# Patient Record
Sex: Female | Born: 1945 | Race: White | Hispanic: No | Marital: Married | Smoking: Never smoker
Health system: Southern US, Community
[De-identification: ages and names within clinical notes are randomized; demographics above are authoritative.]

## PROBLEM LIST (undated history)

## (undated) DIAGNOSIS — R42 Dizziness and giddiness: Secondary | ICD-10-CM

## (undated) DIAGNOSIS — R112 Nausea with vomiting, unspecified: Secondary | ICD-10-CM

## (undated) DIAGNOSIS — Z972 Presence of dental prosthetic device (complete) (partial): Secondary | ICD-10-CM

## (undated) DIAGNOSIS — E785 Hyperlipidemia, unspecified: Secondary | ICD-10-CM

## (undated) DIAGNOSIS — M1990 Unspecified osteoarthritis, unspecified site: Secondary | ICD-10-CM

## (undated) DIAGNOSIS — G35 Multiple sclerosis: Secondary | ICD-10-CM

## (undated) DIAGNOSIS — K219 Gastro-esophageal reflux disease without esophagitis: Secondary | ICD-10-CM

## (undated) DIAGNOSIS — E119 Type 2 diabetes mellitus without complications: Secondary | ICD-10-CM

## (undated) DIAGNOSIS — G4730 Sleep apnea, unspecified: Secondary | ICD-10-CM

## (undated) DIAGNOSIS — C801 Malignant (primary) neoplasm, unspecified: Secondary | ICD-10-CM

## (undated) DIAGNOSIS — Z9889 Other specified postprocedural states: Secondary | ICD-10-CM

## (undated) DIAGNOSIS — J45909 Unspecified asthma, uncomplicated: Secondary | ICD-10-CM

## (undated) HISTORY — PX: COLONOSCOPY: SHX174

## (undated) HISTORY — PX: ESOPHAGOGASTRODUODENOSCOPY: SHX1529

## (undated) HISTORY — PX: ABDOMINAL HYSTERECTOMY: SHX81

## (undated) HISTORY — PX: OTHER SURGICAL HISTORY: SHX169

---

## 1968-07-23 HISTORY — PX: BREAST EXCISIONAL BIOPSY: SUR124

## 2003-07-24 DIAGNOSIS — C801 Malignant (primary) neoplasm, unspecified: Secondary | ICD-10-CM

## 2003-07-24 HISTORY — DX: Malignant (primary) neoplasm, unspecified: C80.1

## 2004-06-07 ENCOUNTER — Ambulatory Visit: Payer: Self-pay | Admitting: Internal Medicine

## 2004-11-27 ENCOUNTER — Ambulatory Visit: Payer: Self-pay | Admitting: Unknown Physician Specialty

## 2005-09-12 ENCOUNTER — Ambulatory Visit: Payer: Self-pay | Admitting: Internal Medicine

## 2006-09-30 ENCOUNTER — Ambulatory Visit: Payer: Self-pay | Admitting: Internal Medicine

## 2007-10-02 ENCOUNTER — Ambulatory Visit: Payer: Self-pay | Admitting: Internal Medicine

## 2008-10-04 ENCOUNTER — Ambulatory Visit: Payer: Self-pay | Admitting: Internal Medicine

## 2009-10-06 ENCOUNTER — Ambulatory Visit: Payer: Self-pay | Admitting: Internal Medicine

## 2010-05-11 ENCOUNTER — Ambulatory Visit: Payer: Self-pay | Admitting: Neurology

## 2010-05-24 ENCOUNTER — Ambulatory Visit: Payer: Self-pay | Admitting: Neurology

## 2010-10-09 ENCOUNTER — Ambulatory Visit: Payer: Self-pay | Admitting: Internal Medicine

## 2011-10-18 ENCOUNTER — Ambulatory Visit: Payer: Self-pay | Admitting: Internal Medicine

## 2012-03-28 ENCOUNTER — Ambulatory Visit: Payer: Self-pay | Admitting: Internal Medicine

## 2012-04-04 ENCOUNTER — Ambulatory Visit: Payer: Self-pay | Admitting: Internal Medicine

## 2012-04-04 LAB — CREATININE, SERUM: Creatinine: 0.77 mg/dL (ref 0.60–1.30)

## 2012-04-10 ENCOUNTER — Ambulatory Visit: Payer: Self-pay | Admitting: Hematology and Oncology

## 2012-04-22 ENCOUNTER — Ambulatory Visit: Payer: Self-pay | Admitting: Hematology and Oncology

## 2012-05-23 ENCOUNTER — Ambulatory Visit: Payer: Self-pay | Admitting: Hematology and Oncology

## 2012-10-20 ENCOUNTER — Ambulatory Visit: Payer: Self-pay | Admitting: Internal Medicine

## 2012-10-21 ENCOUNTER — Ambulatory Visit: Payer: Self-pay | Admitting: Hematology and Oncology

## 2012-10-24 ENCOUNTER — Ambulatory Visit: Payer: Self-pay | Admitting: Hematology and Oncology

## 2012-11-20 ENCOUNTER — Ambulatory Visit: Payer: Self-pay | Admitting: Hematology and Oncology

## 2013-08-15 ENCOUNTER — Emergency Department: Payer: Self-pay | Admitting: Emergency Medicine

## 2013-08-15 LAB — CBC WITH DIFFERENTIAL/PLATELET
BASOS PCT: 0.6 %
Basophil #: 0.1 10*3/uL (ref 0.0–0.1)
Eosinophil #: 0.1 10*3/uL (ref 0.0–0.7)
Eosinophil %: 1.4 %
HCT: 42.5 % (ref 35.0–47.0)
HGB: 14.6 g/dL (ref 12.0–16.0)
Lymphocyte #: 2 10*3/uL (ref 1.0–3.6)
Lymphocyte %: 21.4 %
MCH: 30.3 pg (ref 26.0–34.0)
MCHC: 34.3 g/dL (ref 32.0–36.0)
MCV: 88 fL (ref 80–100)
MONO ABS: 0.5 x10 3/mm (ref 0.2–0.9)
MONOS PCT: 5.7 %
NEUTROS ABS: 6.6 10*3/uL — AB (ref 1.4–6.5)
NEUTROS PCT: 70.9 %
Platelet: 137 10*3/uL — ABNORMAL LOW (ref 150–440)
RBC: 4.81 10*6/uL (ref 3.80–5.20)
RDW: 12.4 % (ref 11.5–14.5)
WBC: 9.3 10*3/uL (ref 3.6–11.0)

## 2013-08-15 LAB — BASIC METABOLIC PANEL
Anion Gap: 5 — ABNORMAL LOW (ref 7–16)
BUN: 10 mg/dL (ref 7–18)
Calcium, Total: 8.7 mg/dL (ref 8.5–10.1)
Chloride: 104 mmol/L (ref 98–107)
Co2: 29 mmol/L (ref 21–32)
Creatinine: 0.65 mg/dL (ref 0.60–1.30)
EGFR (African American): >60
Glucose: 156 mg/dL — ABNORMAL HIGH (ref 65–99)
Osmolality: 278 (ref 275–301)
POTASSIUM: 2.9 mmol/L — AB (ref 3.5–5.1)
Sodium: 138 mmol/L (ref 136–145)

## 2013-08-15 LAB — URINALYSIS, COMPLETE
BLOOD: NEGATIVE
Bilirubin,UR: NEGATIVE
Leukocyte Esterase: NEGATIVE
Nitrite: NEGATIVE
Ph: 8 (ref 4.5–8.0)
Protein: NEGATIVE
RBC,UR: 1 /HPF (ref 0–5)
Specific Gravity: 1.009 (ref 1.003–1.030)
Squamous Epithelial: 1
WBC UR: NONE SEEN /HPF (ref 0–5)

## 2013-08-15 LAB — PHENYTOIN LEVEL, TOTAL: Dilantin: 11.7 ug/mL (ref 10.0–20.0)

## 2013-08-15 LAB — TROPONIN I: Troponin-I: <0.02 ng/mL

## 2013-08-21 ENCOUNTER — Ambulatory Visit: Payer: Self-pay | Admitting: Internal Medicine

## 2013-10-29 ENCOUNTER — Ambulatory Visit: Payer: Self-pay | Admitting: Hematology and Oncology

## 2013-11-20 ENCOUNTER — Ambulatory Visit: Payer: Self-pay | Admitting: Hematology and Oncology

## 2013-12-23 ENCOUNTER — Ambulatory Visit: Payer: Self-pay | Admitting: Specialist

## 2013-12-24 ENCOUNTER — Ambulatory Visit: Payer: Self-pay | Admitting: Internal Medicine

## 2013-12-28 ENCOUNTER — Ambulatory Visit: Payer: Self-pay | Admitting: Internal Medicine

## 2014-01-01 ENCOUNTER — Ambulatory Visit: Payer: Self-pay | Admitting: Internal Medicine

## 2014-01-01 HISTORY — PX: BREAST CYST ASPIRATION: SHX578

## 2014-01-19 ENCOUNTER — Ambulatory Visit: Payer: Self-pay | Admitting: Specialist

## 2014-03-25 DIAGNOSIS — J4520 Mild intermittent asthma, uncomplicated: Secondary | ICD-10-CM | POA: Insufficient documentation

## 2014-11-19 ENCOUNTER — Other Ambulatory Visit: Payer: Self-pay

## 2014-11-19 DIAGNOSIS — Z1231 Encounter for screening mammogram for malignant neoplasm of breast: Secondary | ICD-10-CM

## 2014-12-27 ENCOUNTER — Other Ambulatory Visit: Payer: Self-pay

## 2014-12-27 ENCOUNTER — Ambulatory Visit
Admission: RE | Admit: 2014-12-27 | Discharge: 2014-12-27 | Disposition: A | Discharge status: Home / Self Care | Payer: Medicare Other | Source: Ambulatory Visit | Attending: Internal Medicine | Admitting: Internal Medicine

## 2014-12-27 ENCOUNTER — Other Ambulatory Visit: Payer: Self-pay | Admitting: Internal Medicine

## 2014-12-27 DIAGNOSIS — Z1231 Encounter for screening mammogram for malignant neoplasm of breast: Secondary | ICD-10-CM

## 2014-12-27 HISTORY — DX: Malignant (primary) neoplasm, unspecified: C80.1

## 2015-01-14 ENCOUNTER — Encounter: Payer: Self-pay | Admitting: *Deleted

## 2015-01-17 ENCOUNTER — Encounter: Payer: Self-pay | Admitting: *Deleted

## 2015-01-17 ENCOUNTER — Ambulatory Visit
Admission: RE | Admit: 2015-01-17 | Discharge: 2015-01-17 | Disposition: A | Discharge status: Home / Self Care | Payer: Medicare Other | Source: Ambulatory Visit | Attending: Unknown Physician Specialty | Admitting: Unknown Physician Specialty

## 2015-01-17 ENCOUNTER — Ambulatory Visit: Payer: Medicare Other | Admitting: Anesthesiology

## 2015-01-17 ENCOUNTER — Encounter
Admission: RE | Disposition: A | Discharge status: Home / Self Care | Payer: Self-pay | Source: Ambulatory Visit | Attending: Unknown Physician Specialty

## 2015-01-17 DIAGNOSIS — Z9889 Other specified postprocedural states: Secondary | ICD-10-CM | POA: Diagnosis not present

## 2015-01-17 DIAGNOSIS — J45909 Unspecified asthma, uncomplicated: Secondary | ICD-10-CM | POA: Diagnosis not present

## 2015-01-17 DIAGNOSIS — E119 Type 2 diabetes mellitus without complications: Secondary | ICD-10-CM | POA: Diagnosis not present

## 2015-01-17 DIAGNOSIS — Z1211 Encounter for screening for malignant neoplasm of colon: Secondary | ICD-10-CM | POA: Insufficient documentation

## 2015-01-17 DIAGNOSIS — Z7982 Long term (current) use of aspirin: Secondary | ICD-10-CM | POA: Diagnosis not present

## 2015-01-17 DIAGNOSIS — G35 Multiple sclerosis: Secondary | ICD-10-CM | POA: Diagnosis not present

## 2015-01-17 DIAGNOSIS — G4730 Sleep apnea, unspecified: Secondary | ICD-10-CM | POA: Diagnosis not present

## 2015-01-17 DIAGNOSIS — Z79899 Other long term (current) drug therapy: Secondary | ICD-10-CM | POA: Insufficient documentation

## 2015-01-17 DIAGNOSIS — M1990 Unspecified osteoarthritis, unspecified site: Secondary | ICD-10-CM | POA: Diagnosis not present

## 2015-01-17 DIAGNOSIS — Z85828 Personal history of other malignant neoplasm of skin: Secondary | ICD-10-CM | POA: Insufficient documentation

## 2015-01-17 DIAGNOSIS — K640 First degree hemorrhoids: Secondary | ICD-10-CM | POA: Insufficient documentation

## 2015-01-17 DIAGNOSIS — E785 Hyperlipidemia, unspecified: Secondary | ICD-10-CM | POA: Insufficient documentation

## 2015-01-17 HISTORY — PX: COLONOSCOPY WITH PROPOFOL: SHX5780

## 2015-01-17 HISTORY — DX: Sleep apnea, unspecified: G47.30

## 2015-01-17 HISTORY — DX: Multiple sclerosis: G35

## 2015-01-17 HISTORY — DX: Unspecified asthma, uncomplicated: J45.909

## 2015-01-17 HISTORY — DX: Unspecified osteoarthritis, unspecified site: M19.90

## 2015-01-17 HISTORY — DX: Type 2 diabetes mellitus without complications: E11.9

## 2015-01-17 HISTORY — DX: Hyperlipidemia, unspecified: E78.5

## 2015-01-17 LAB — GLUCOSE, CAPILLARY: GLUCOSE-CAPILLARY: 118 mg/dL — AB (ref 65–99)

## 2015-01-17 SURGERY — COLONOSCOPY WITH PROPOFOL
Anesthesia: General

## 2015-01-17 MED ORDER — LIDOCAINE HCL (PF) 2 % IJ SOLN
INTRAMUSCULAR | Status: DC | PRN
  Administered 2015-01-17: 50 mg

## 2015-01-17 MED ORDER — MIDAZOLAM HCL 5 MG/5ML IJ SOLN
INTRAMUSCULAR | Status: DC | PRN
  Administered 2015-01-17: 1 mg via INTRAVENOUS

## 2015-01-17 MED ORDER — SODIUM CHLORIDE 0.9 % IV SOLN: INTRAVENOUS | Status: DC

## 2015-01-17 MED ORDER — PROPOFOL INFUSION 10 MG/ML OPTIME
INTRAVENOUS | Status: DC | PRN
  Administered 2015-01-17: 100 ug/kg/min via INTRAVENOUS

## 2015-01-17 MED ORDER — LACTATED RINGERS IV SOLN
INTRAVENOUS | Status: DC | PRN
  Administered 2015-01-17: 09:00:00 via INTRAVENOUS

## 2015-01-17 MED ORDER — PROPOFOL 10 MG/ML IV BOLUS
INTRAVENOUS | Status: DC | PRN
  Administered 2015-01-17: 50 mg via INTRAVENOUS

## 2015-01-17 MED ORDER — FENTANYL CITRATE (PF) 100 MCG/2ML IJ SOLN
INTRAMUSCULAR | Status: DC | PRN
  Administered 2015-01-17: 50 ug via INTRAVENOUS

## 2015-01-17 MED ORDER — SODIUM CHLORIDE 0.9 % IV SOLN
INTRAVENOUS | Status: DC
  Administered 2015-01-17: 08:00:00 via INTRAVENOUS

## 2015-01-17 NOTE — Anesthesia Postprocedure Evaluation (Signed)
  Anesthesia Post-op Note  Patient: Caroline Hall  Procedure(s) Performed: Procedure(s): COLONOSCOPY WITH PROPOFOL (N/A)  Anesthesia type:General  Patient location: PACU  Post pain: Pain level controlled  Post assessment: Post-op Vital signs reviewed, Patient's Cardiovascular Status Stable, Respiratory Function Stable, Patent Airway and No signs of Nausea or vomiting  Post vital signs: Reviewed and stable  Last Vitals:  Filed Vitals:   01/17/15 0910  BP: 104/55  Pulse: 81  Temp:   Resp: 18    Level of consciousness: awake, alert  and patient cooperative  Complications: No apparent anesthesia complications

## 2015-01-17 NOTE — Anesthesia Preprocedure Evaluation (Signed)
Anesthesia Evaluation  Patient identified by MRN, date of birth, ID band Patient awake    Reviewed: Allergy & Precautions, H&P , NPO status , Patient's Chart, lab work & pertinent test results, reviewed documented beta blocker date and time   Airway Mallampati: II  TM Distance: >3 FB Neck ROM: full    Dental no notable dental hx.    Pulmonary neg pulmonary ROS, asthma , sleep apnea ,  breath sounds clear to auscultation  Pulmonary exam normal       Cardiovascular Exercise Tolerance: Good negative cardio ROS  Rhythm:regular Rate:Normal     Neuro/Psych MS hx negative neurological ROS  negative psych ROS   GI/Hepatic negative GI ROS, Neg liver ROS,   Endo/Other  negative endocrine ROSdiabetes  Renal/GU negative Renal ROS  negative genitourinary   Musculoskeletal   Abdominal   Peds  Hematology negative hematology ROS (+)   Anesthesia Other Findings   Reproductive/Obstetrics negative OB ROS                             Anesthesia Physical Anesthesia Plan  ASA: III  Anesthesia Plan: General   Post-op Pain Management:    Induction:   Airway Management Planned:   Additional Equipment:   Intra-op Plan:   Post-operative Plan:   Informed Consent: I have reviewed the patients History and Physical, chart, labs and discussed the procedure including the risks, benefits and alternatives for the proposed anesthesia with the patient or authorized representative who has indicated his/her understanding and acceptance.   Dental Advisory Given  Plan Discussed with: CRNA  Anesthesia Plan Comments:         Anesthesia Quick Evaluation

## 2015-01-17 NOTE — Op Note (Signed)
[facility] Patient Name: Caroline Hall Procedure Date: 01/17/2015 8:31 AM MRN: [id number] Account #: [id number] Date of Birth: [birth year] Admit Type: Outpatient Age: 69 Room: [facility] ENDO ROOM 1 Gender: Female Note Status: Finalized Procedure:         Colonoscopy Indications:       Screening for colorectal malignant neoplasm Providers:         Manya Silvas, MD Referring MD:      Leonie Douglas. Doy Hutching, MD (Referring MD) Medicines:         Propofol per Anesthesia Complications:     No immediate complications. Procedure:         Pre-Anesthesia Assessment:                    - After reviewing the risks and benefits, the patient was                     deemed in satisfactory condition to undergo the procedure.                    After obtaining informed consent, the colonoscope was                     passed under direct vision. Throughout the procedure, the                     patient's blood pressure, pulse, and oxygen saturations                     were monitored continuously. The Olympus PCF-160AL                     colonoscope (S#. [id number]) was introduced through the anus                     and advanced to the the cecum, identified by appendiceal                     orifice and ileocecal valve. The colonoscopy was performed                     without difficulty. The patient tolerated the procedure                     well. The quality of the bowel preparation was excellent. Findings:      Internal hemorrhoids were found during endoscopy. The hemorrhoids were       small and Grade I (internal hemorrhoids that do not prolapse).      The exam was otherwise without abnormality. Impression:        - Internal hemorrhoids.                    - The examination was otherwise normal.                    - No specimens collected. Recommendation:    - Repeat colonoscopy in 10 years for screening purposes. Manya Silvas, MD 01/17/2015 8:57:03 AM This  report has been signed electronically. Number of Addenda: 0 Note Initiated On: 01/17/2015 8:31 AM Scope Withdrawal Time: 0 hours 10 minutes 0 seconds  Total Procedure Duration: 0 hours 16 minutes 50 seconds       [facility], [place]

## 2015-01-17 NOTE — H&P (Signed)
Primary Care Physician:  Idelle Crouch, MD Primary Gastroenterologist:  Dr. Vira Agar  Pre-Procedure History & Physical: HPI:  Caroline Hall is a 69 y.o. female is here for an colonoscopy.   Past Medical History  Diagnosis Date  . Cancer 2005    skin, face  . Asthma   . Sleep apnea   . Multiple sclerosis   . Arthritis   . Diabetes mellitus without complication   . Hyperlipidemia     Past Surgical History  Procedure Laterality Date  . Breast cyst aspiration Right 01/01/2014    negative  . Breast biopsy Bilateral 1970    surgical biopsy, negative, approximate dates  . Meningioma removal    . Abdominal hysterectomy    . Colonoscopy  [id number]  . Esophagogastroduodenoscopy      Prior to Admission medications   Medication Sig Start Date End Date Taking? Authorizing Provider  albuterol (PROAIR HFA) 108 (90 BASE) MCG/ACT inhaler Inhale 2 puffs into the lungs every 6 (six) hours as needed for wheezing or shortness of breath.  03/25/14  Yes Historical Provider, MD  atorvastatin (LIPITOR) 10 MG tablet Take 10 mg by mouth daily. 06/28/14  Yes Historical Provider, MD  Cyanocobalamin ([employer] B-12) 1500 MCG TBDP Take 1 tablet by mouth daily.   Yes Historical Provider, MD  diphenhydrAMINE (BENADRYL) 25 MG tablet Take 25 mg by mouth every 6 (six) hours as needed for allergies.   Yes Historical Provider, MD  folic acid (FOLVITE) 762 MCG tablet Take 800 mcg by mouth daily.   Yes Historical Provider, MD  gabapentin (NEURONTIN) 300 MG capsule Take 300 mg by mouth at bedtime. 05/14/14  Yes Historical Provider, MD  glimepiride (AMARYL) 4 MG tablet Take 4 mg by mouth daily with breakfast.   Yes Historical Provider, MD  montelukast (SINGULAIR) 10 MG tablet Take 10 mg by mouth at bedtime.   Yes Historical Provider, MD  NON FORMULARY Take 10 mg by mouth 2 (two) times daily. 4 aminopyridine   Yes Historical Provider, MD  phenytoin (DILANTIN) 100 MG ER capsule Take 300 mg by mouth at bedtime.   Yes  Historical Provider, MD  polyethylene glycol powder (GLYCOLAX/MIRALAX) powder Take as directed for colonoscopy prep. 12/07/14  Yes Historical Provider, MD  aspirin EC 81 MG tablet Take 81 mg by mouth daily.    Historical Provider, MD    Allergies as of 12/07/2014  . (Not on File)    Family History  Problem Relation Age of Onset  . Breast cancer Maternal Grandmother 60    History   Social History  . Marital Status: Married    Spouse Name: N/A  . Number of Children: N/A  . Years of Education: N/A   Occupational History  . Not on file.   Social History Main Topics  . Smoking status: Never Smoker   . Smokeless tobacco: Never Used  . Alcohol Use: No  . Drug Use: No  . Sexual Activity: Not on file   Other Topics Concern  . Not on file   Social History Narrative    Review of Systems: See HPI, otherwise negative ROS  Physical Exam: BP 151/90 mmHg  Pulse 85  Temp(Src) 97.4 F (36.3 C) (Tympanic)  Resp 18  Ht 5\' 4"  (1.626 m)  Wt 73.029 kg (161 lb)  BMI 27.62 kg/m2  SpO2 100% General:   Alert,  pleasant and cooperative in NAD Head:  Normocephalic and atraumatic. Neck:  Supple; no masses or thyromegaly. Lungs:  Clear throughout  to auscultation.    Heart:  Regular rate and rhythm. Abdomen:  Soft, nontender and nondistended. Normal bowel sounds, without guarding, and without rebound.   Neurologic:  Alert and  oriented x4;  grossly normal neurologically.  Impression/Plan: Caroline Hall is here for an colonoscopy to be performed for screening  Risks, benefits, limitations, and alternatives regarding  colonoscopy have been reviewed with the patient.  Questions have been answered.  All parties agreeable.   Gaylyn Cheers, MD  01/17/2015, 8:30 AM   Primary Care Physician:  Idelle Crouch, MD Primary Gastroenterologist:  Dr. Vira Agar  Pre-Procedure History & Physical: HPI:  Caroline Hall is a 69 y.o. female is here for an colonoscopy.   Past Medical History   Diagnosis Date  . Cancer 2005    skin, face  . Asthma   . Sleep apnea   . Multiple sclerosis   . Arthritis   . Diabetes mellitus without complication   . Hyperlipidemia     Past Surgical History  Procedure Laterality Date  . Breast cyst aspiration Right 01/01/2014    negative  . Breast biopsy Bilateral 1970    surgical biopsy, negative, approximate dates  . Meningioma removal    . Abdominal hysterectomy    . Colonoscopy  [id number]  . Esophagogastroduodenoscopy      Prior to Admission medications   Medication Sig Start Date End Date Taking? Authorizing Provider  albuterol (PROAIR HFA) 108 (90 BASE) MCG/ACT inhaler Inhale 2 puffs into the lungs every 6 (six) hours as needed for wheezing or shortness of breath.  03/25/14  Yes Historical Provider, MD  atorvastatin (LIPITOR) 10 MG tablet Take 10 mg by mouth daily. 06/28/14  Yes Historical Provider, MD  Cyanocobalamin ([employer]S B-12) 1500 MCG TBDP Take 1 tablet by mouth daily.   Yes Historical Provider, MD  diphenhydrAMINE (BENADRYL) 25 MG tablet Take 25 mg by mouth every 6 (six) hours as needed for allergies.   Yes Historical Provider, MD  folic acid (FOLVITE) 007 MCG tablet Take 800 mcg by mouth daily.   Yes Historical Provider, MD  gabapentin (NEURONTIN) 300 MG capsule Take 300 mg by mouth at bedtime. 05/14/14  Yes Historical Provider, MD  glimepiride (AMARYL) 4 MG tablet Take 4 mg by mouth daily with breakfast.   Yes Historical Provider, MD  montelukast (SINGULAIR) 10 MG tablet Take 10 mg by mouth at bedtime.   Yes Historical Provider, MD  NON FORMULARY Take 10 mg by mouth 2 (two) times daily. 4 aminopyridine   Yes Historical Provider, MD  phenytoin (DILANTIN) 100 MG ER capsule Take 300 mg by mouth at bedtime.   Yes Historical Provider, MD  polyethylene glycol powder (GLYCOLAX/MIRALAX) powder Take as directed for colonoscopy prep. 12/07/14  Yes Historical Provider, MD  aspirin EC 81 MG tablet Take 81 mg by mouth daily.    Historical  Provider, MD    Allergies as of 12/07/2014  . (Not on File)    Family History  Problem Relation Age of Onset  . Breast cancer Maternal Grandmother 60    History   Social History  . Marital Status: Married    Spouse Name: N/A  . Number of Children: N/A  . Years of Education: N/A   Occupational History  . Not on file.   Social History Main Topics  . Smoking status: Never Smoker   . Smokeless tobacco: Never Used  . Alcohol Use: No  . Drug Use: No  . Sexual Activity: Not on file  Other Topics Concern  . Not on file   Social History Narrative    Review of Systems: See HPI, otherwise negative ROS  Physical Exam: BP 151/90 mmHg  Pulse 85  Temp(Src) 97.4 F (36.3 C) (Tympanic)  Resp 18  Ht 5\' 4"  (1.626 m)  Wt 73.029 kg (161 lb)  BMI 27.62 kg/m2  SpO2 100% General:   Alert,  pleasant and cooperative in NAD Head:  Normocephalic and atraumatic. Neck:  Supple; no masses or thyromegaly. Lungs:  Clear throughout to auscultation.    Heart:  Regular rate and rhythm. Abdomen:  Soft, nontender and nondistended. Normal bowel sounds, without guarding, and without rebound.   Neurologic:  Alert and  oriented x4;  grossly normal neurologically.  Impression/Plan: JEZABEL LECKER is here for an colonoscopy to be performed for screening  Risks, benefits, limitations, and alternatives regarding  colonoscopy have been reviewed with the patient.  Questions have been answered.  All parties agreeable.   Gaylyn Cheers, MD  01/17/2015, 8:30 AM

## 2015-01-17 NOTE — Transfer of Care (Signed)
Immediate Anesthesia Transfer of Care Note  Patient: Caroline Hall  Procedure(s) Performed: Procedure(s): COLONOSCOPY WITH PROPOFOL (N/A)  Patient Location: PACU  Anesthesia Type:General  Level of Consciousness: sedated  Airway & Oxygen Therapy: Patient Spontanous Breathing and Patient connected to nasal cannula oxygen  Post-op Assessment: Report given to RN and Post -op Vital signs reviewed and stable  Post vital signs: Reviewed and stable  Last Vitals:  Filed Vitals:   01/17/15 0903  BP: 124/58  Pulse: 80  Temp:   Resp: 21    Complications: No apparent anesthesia complications

## 2015-03-07 IMAGING — CR DG CHEST 2V
1 series · 2 of 2 positions shown · non-contrast
Comparison: None.

CLINICAL DATA: Dizziness.  Hypertension.

EXAM:
CHEST  2 VIEW

[Series 1: ap · 0.17mm/px · 2 of 2 slices shown]
[im 1/2]
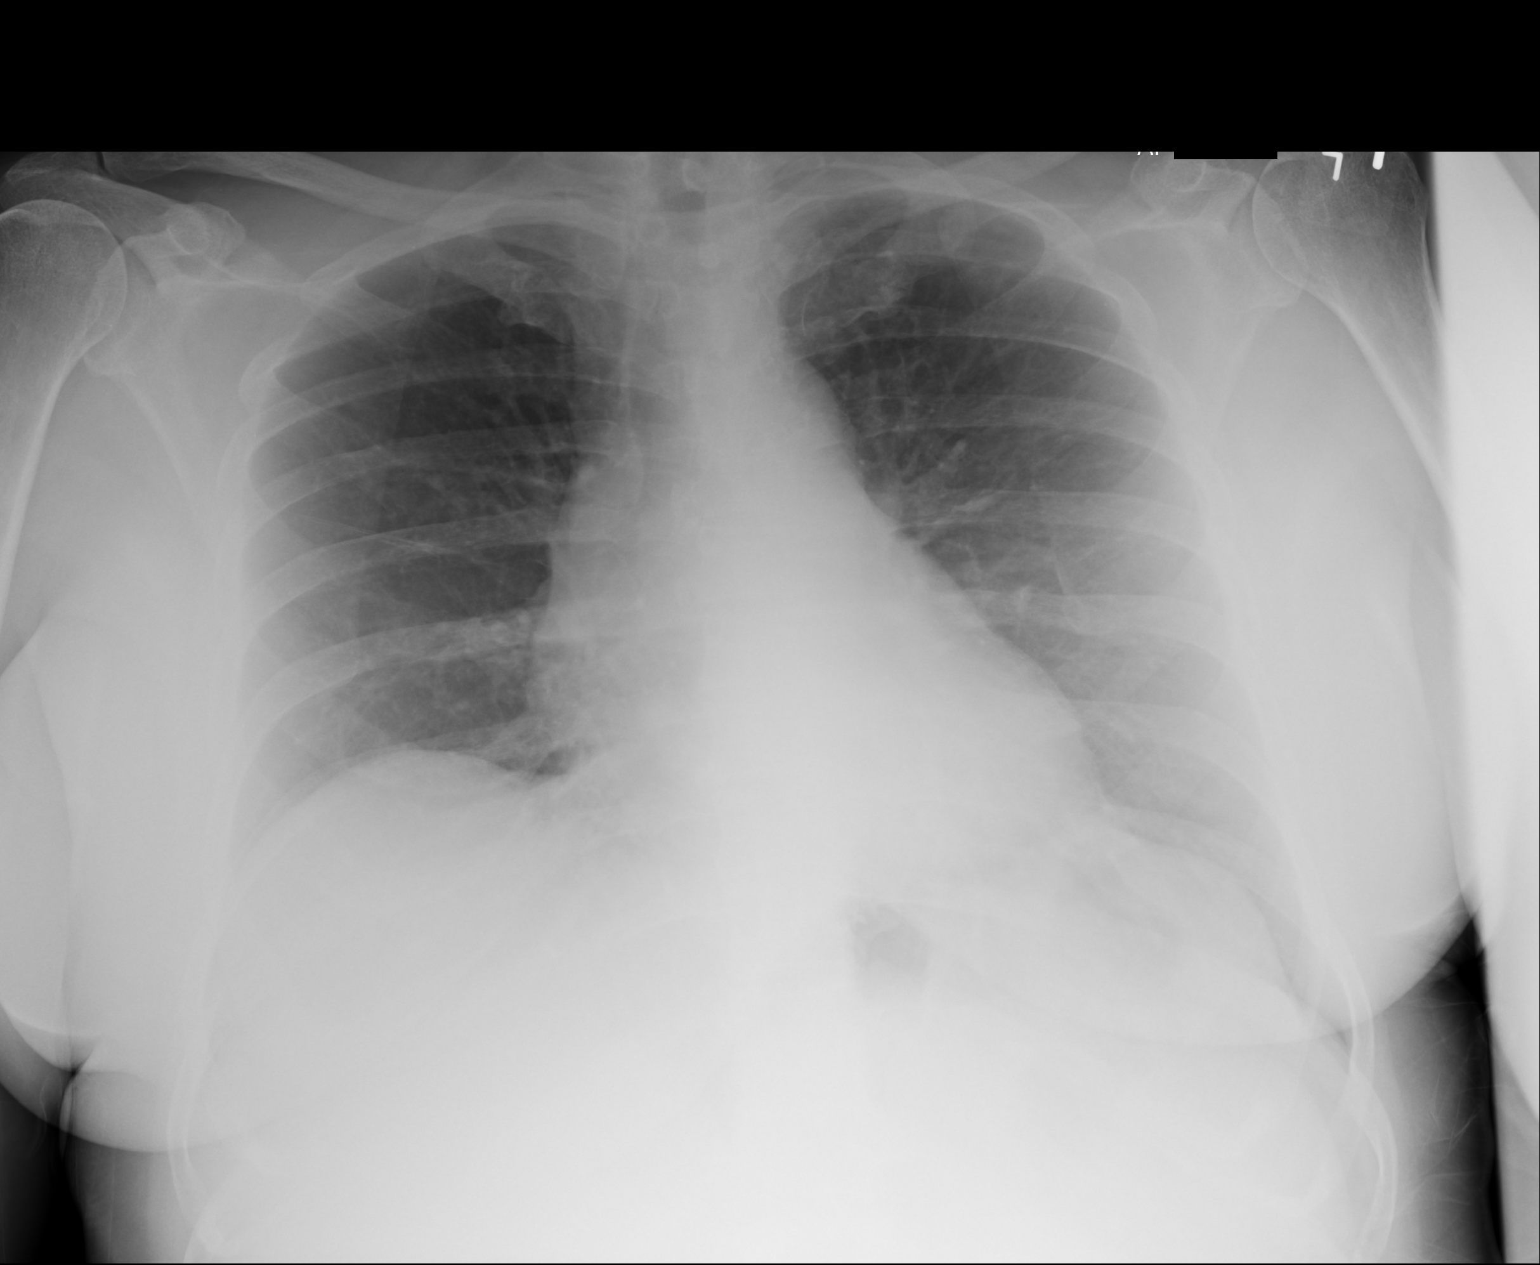
[im 2/2]
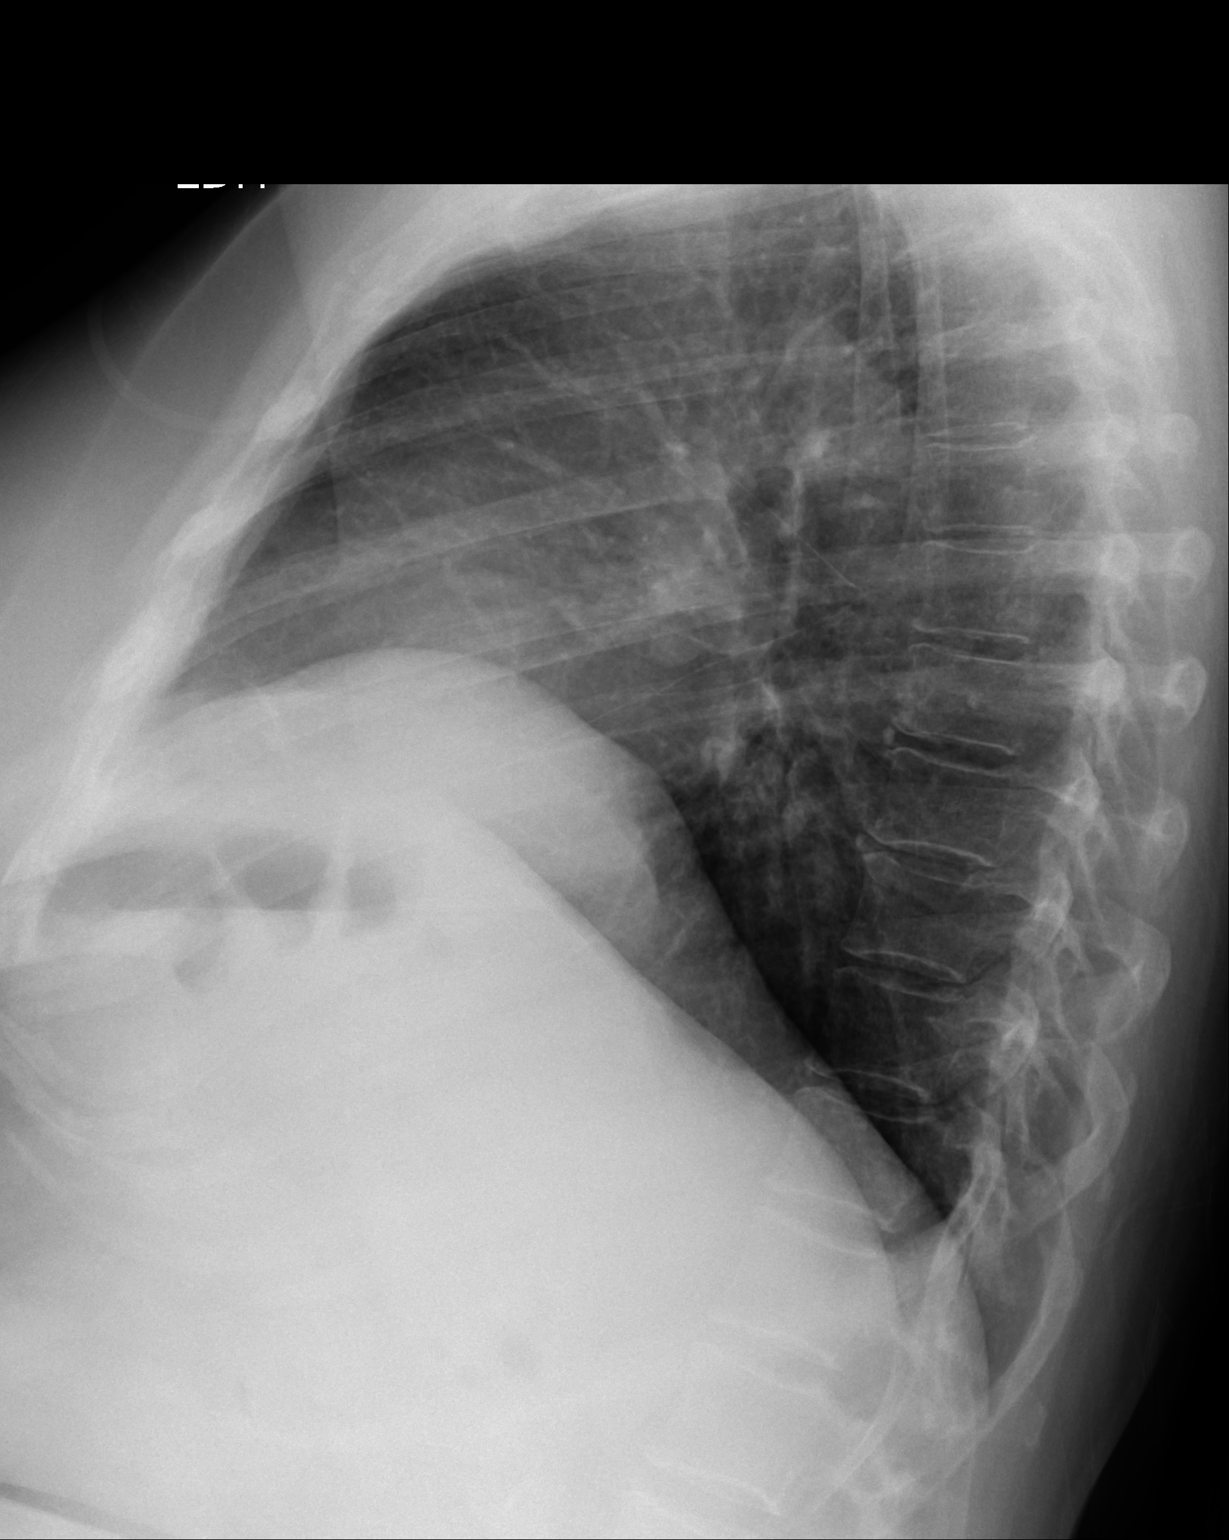

[2 of 2 positions shown; findings below may reference images not displayed]

FINDINGS: The heart size and mediastinal contours are within normal limits.
Both lungs are clear. The visualized skeletal structures are
unremarkable.
IMPRESSION: No active cardiopulmonary disease.

## 2015-12-09 ENCOUNTER — Other Ambulatory Visit: Payer: Self-pay | Admitting: Internal Medicine

## 2015-12-09 DIAGNOSIS — Z1231 Encounter for screening mammogram for malignant neoplasm of breast: Secondary | ICD-10-CM

## 2015-12-28 ENCOUNTER — Other Ambulatory Visit: Payer: Self-pay | Admitting: Internal Medicine

## 2015-12-28 ENCOUNTER — Ambulatory Visit
Admission: RE | Admit: 2015-12-28 | Discharge: 2015-12-28 | Disposition: A | Discharge status: Home / Self Care | Payer: Medicare Other | Source: Ambulatory Visit | Attending: Internal Medicine | Admitting: Internal Medicine

## 2015-12-28 DIAGNOSIS — Z1231 Encounter for screening mammogram for malignant neoplasm of breast: Secondary | ICD-10-CM | POA: Diagnosis present

## 2016-12-06 ENCOUNTER — Other Ambulatory Visit: Payer: Self-pay | Admitting: Internal Medicine

## 2016-12-06 DIAGNOSIS — Z1231 Encounter for screening mammogram for malignant neoplasm of breast: Secondary | ICD-10-CM

## 2017-01-01 ENCOUNTER — Ambulatory Visit
Admission: RE | Admit: 2017-01-01 | Discharge: 2017-01-01 | Disposition: A | Discharge status: Home / Self Care | Payer: Medicare Other | Source: Ambulatory Visit | Attending: Internal Medicine | Admitting: Internal Medicine

## 2017-01-01 DIAGNOSIS — Z1231 Encounter for screening mammogram for malignant neoplasm of breast: Secondary | ICD-10-CM | POA: Diagnosis present

## 2017-06-10 ENCOUNTER — Other Ambulatory Visit: Payer: Self-pay | Admitting: Internal Medicine

## 2017-06-10 DIAGNOSIS — E278 Other specified disorders of adrenal gland: Secondary | ICD-10-CM

## 2017-06-10 DIAGNOSIS — E279 Disorder of adrenal gland, unspecified: Principal | ICD-10-CM

## 2017-06-18 ENCOUNTER — Other Ambulatory Visit: Payer: Self-pay | Admitting: Internal Medicine

## 2017-06-18 DIAGNOSIS — N281 Cyst of kidney, acquired: Secondary | ICD-10-CM

## 2017-06-28 ENCOUNTER — Ambulatory Visit
Admission: RE | Admit: 2017-06-28 | Discharge: 2017-06-28 | Disposition: A | Discharge status: Home / Self Care | Payer: Medicare Other | Source: Ambulatory Visit | Attending: Internal Medicine | Admitting: Internal Medicine

## 2017-06-28 DIAGNOSIS — E279 Disorder of adrenal gland, unspecified: Secondary | ICD-10-CM | POA: Insufficient documentation

## 2017-06-28 DIAGNOSIS — N281 Cyst of kidney, acquired: Secondary | ICD-10-CM | POA: Insufficient documentation

## 2017-07-30 ENCOUNTER — Other Ambulatory Visit: Payer: Self-pay | Admitting: Podiatry

## 2017-07-31 ENCOUNTER — Encounter: Payer: Self-pay | Admitting: Anesthesiology

## 2017-07-31 ENCOUNTER — Encounter: Payer: Self-pay | Admitting: *Deleted

## 2017-07-31 ENCOUNTER — Other Ambulatory Visit: Payer: Self-pay

## 2017-08-02 ENCOUNTER — Other Ambulatory Visit: Payer: Self-pay | Admitting: Podiatry

## 2017-08-05 NOTE — Discharge Instructions (Signed)
[facility] °[facility] ° °POST OPERATIVE INSTRUCTIONS FOR DR. TROXLER AND DR. FOWLER °[facility] ° ° °1. Take your medication as prescribed.  Pain medication should be taken only as needed. ° °2. Keep the dressing clean, dry and intact. ° °3. Keep your foot elevated above the heart level for the first 48 hours. ° °4. Walking to the bathroom and brief periods of walking are acceptable, unless we have instructed you to be non-weight bearing. ° °5. Always wear your post-op shoe when walking.  Always use your crutches if you are to be non-weight bearing. ° °6. Do not take a shower. Baths are permissible as long as the foot is kept out of the water.  ° °7. Every hour you are awake:  °- Bend your knee 15 times. °- Flex foot 15 times °- Massage calf 15 times ° °8. Call [facility] ([phone]) if any of the following problems occur: °- You develop a temperature or fever. °- The bandage becomes saturated with blood. °- Medication does not stop your pain. °- Injury of the foot occurs. °- Any symptoms of infection including redness, odor, or red streaks running from wound. ° ° °General Anesthesia, Adult, Care After °These instructions provide you with information about caring for yourself after your procedure. Your health care provider may also give you more specific instructions. Your treatment has been planned according to current medical practices, but problems sometimes occur. Call your health care provider if you have any problems or questions after your procedure. °What can I expect after the procedure? °After the procedure, it is common to have: °· Vomiting. °· A sore throat. °· Mental slowness. ° °It is common to feel: °· Nauseous. °· Cold or shivery. °· Sleepy. °· Tired. °· Sore or achy, even in parts of your body where you did not have surgery. ° °Follow these instructions at home: °For at least 24 hours after the procedure: °· Do not: °? Participate in  activities where you could fall or become injured. °? Drive. °? Use heavy machinery. °? Drink alcohol. °? Take sleeping pills or medicines that cause drowsiness. °? Make important decisions or sign legal documents. °? Take care of children on your own. °· Rest. °Eating and drinking °· If you vomit, drink water, juice, or soup when you can drink without vomiting. °· Drink enough fluid to keep your urine clear or pale yellow. °· Make sure you have little or no nausea before eating solid foods. °· Follow the diet recommended by your health care provider. °General instructions °· Have a responsible adult stay with you until you are awake and alert. °· Return to your normal activities as told by your health care provider. Ask your health care provider what activities are safe for you. °· Take over-the-counter and prescription medicines only as told by your health care provider. °· If you smoke, do not smoke without supervision. °· Keep all follow-up visits as told by your health care provider. This is important. °Contact a health care provider if: °· You continue to have nausea or vomiting at home, and medicines are not helpful. °· You cannot drink fluids or start eating again. °· You cannot urinate after 8-12 hours. °· You develop a skin rash. °· You have fever. °· You have increasing redness at the site of your procedure. °Get help right away if: °· You have difficulty breathing. °· You have chest pain. °· You have unexpected bleeding. °· You feel that you   are having a life-threatening or urgent problem. °This information is not intended to replace advice given to you by your health care provider. Make sure you discuss any questions you have with your health care provider. °Document Released: 10/15/2000 Document Revised: 12/12/2015 Document Reviewed: 06/23/2015 °Elsevier Interactive Patient Education © 2018 [employer] ° °

## 2017-08-07 ENCOUNTER — Ambulatory Visit
Admission: RE | Admit: 2017-08-07 | Discharge: 2017-08-07 | Disposition: A | Discharge status: Home / Self Care | Payer: Medicare Other | Source: Ambulatory Visit | Attending: Podiatry | Admitting: Podiatry

## 2017-08-07 ENCOUNTER — Encounter
Admission: RE | Disposition: A | Discharge status: Home / Self Care | Payer: Self-pay | Source: Ambulatory Visit | Attending: Podiatry

## 2017-08-07 DIAGNOSIS — Z538 Procedure and treatment not carried out for other reasons: Secondary | ICD-10-CM | POA: Insufficient documentation

## 2017-08-07 DIAGNOSIS — M2041 Other hammer toe(s) (acquired), right foot: Secondary | ICD-10-CM | POA: Diagnosis present

## 2017-08-07 DIAGNOSIS — R1310 Dysphagia, unspecified: Secondary | ICD-10-CM | POA: Diagnosis not present

## 2017-08-07 DIAGNOSIS — G4730 Sleep apnea, unspecified: Secondary | ICD-10-CM | POA: Insufficient documentation

## 2017-08-07 DIAGNOSIS — Z7984 Long term (current) use of oral hypoglycemic drugs: Secondary | ICD-10-CM | POA: Insufficient documentation

## 2017-08-07 DIAGNOSIS — E119 Type 2 diabetes mellitus without complications: Secondary | ICD-10-CM | POA: Insufficient documentation

## 2017-08-07 HISTORY — DX: Other specified postprocedural states: Z98.890

## 2017-08-07 HISTORY — DX: Gastro-esophageal reflux disease without esophagitis: K21.9

## 2017-08-07 HISTORY — DX: Nausea with vomiting, unspecified: R11.2

## 2017-08-07 HISTORY — DX: Presence of dental prosthetic device (complete) (partial): Z97.2

## 2017-08-07 HISTORY — DX: Dizziness and giddiness: R42

## 2017-08-07 LAB — GLUCOSE, CAPILLARY: Glucose-Capillary: 97 mg/dL (ref 65–99)

## 2017-08-07 SURGERY — CORRECTION, HAMMER TOE
Anesthesia: Choice | Laterality: Right

## 2017-08-07 SURGICAL SUPPLY — 46 items
BANDAGE ELASTIC 4 VELCRO NS (GAUZE/BANDAGES/DRESSINGS) ×2 IMPLANT
BENZOIN TINCTURE PRP APPL 2/3 (GAUZE/BANDAGES/DRESSINGS) ×2 IMPLANT
BLADE MED AGGRESSIVE (BLADE) IMPLANT
BLADE OSC/SAGITTAL MD 5.5X18 (BLADE) IMPLANT
BLADE SURG 15 STRL LF DISP TIS (BLADE) IMPLANT
BLADE SURG 15 STRL SS (BLADE)
BNDG COHESIVE 4X5 TAN STRL (GAUZE/BANDAGES/DRESSINGS) ×2 IMPLANT
BNDG ESMARK 4X12 TAN STRL LF (GAUZE/BANDAGES/DRESSINGS) ×2 IMPLANT
BNDG GAUZE 4.5X4.1 6PLY STRL (MISCELLANEOUS) ×2 IMPLANT
BNDG STRETCH 4X75 STRL LF (GAUZE/BANDAGES/DRESSINGS) ×2 IMPLANT
CANISTER SUCT 1200ML W/VALVE (MISCELLANEOUS) ×2 IMPLANT
COVER LIGHT HANDLE UNIVERSAL (MISCELLANEOUS) ×4 IMPLANT
CUFF TOURN SGL QUICK 18 (TOURNIQUET CUFF) IMPLANT
DRAPE FLUOR MINI C-ARM 54X84 (DRAPES) ×2 IMPLANT
DURAPREP 26ML APPLICATOR (WOUND CARE) ×2 IMPLANT
ELECT REM PT RETURN 9FT ADLT (ELECTROSURGICAL) ×2
ELECTRODE REM PT RTRN 9FT ADLT (ELECTROSURGICAL) ×1 IMPLANT
GAUZE PETRO XEROFOAM 1X8 (MISCELLANEOUS) ×2 IMPLANT
GAUZE SPONGE 4X4 12PLY STRL (GAUZE/BANDAGES/DRESSINGS) ×2 IMPLANT
GLOVE BIO SURGEON STRL SZ7.5 (GLOVE) ×2 IMPLANT
GLOVE INDICATOR 8.0 STRL GRN (GLOVE) ×2 IMPLANT
GOWN STRL REUS W/ TWL LRG LVL3 (GOWN DISPOSABLE) ×2 IMPLANT
GOWN STRL REUS W/TWL LRG LVL3 (GOWN DISPOSABLE) ×2
K-WIRE DBL END TROCAR 6X.045 (WIRE)
K-WIRE DBL END TROCAR 6X.062 (WIRE)
KIT ROOM TURNOVER OR (KITS) ×2 IMPLANT
KWIRE DBL END TROCAR 6X.045 (WIRE) IMPLANT
KWIRE DBL END TROCAR 6X.062 (WIRE) IMPLANT
NS IRRIG 500ML POUR BTL (IV SOLUTION) ×2 IMPLANT
PACK EXTREMITY ARMC (MISCELLANEOUS) ×2 IMPLANT
PIN BALLS 3/8 F/.045 WIRE (MISCELLANEOUS) ×2 IMPLANT
RASP SM TEAR CROSS CUT (RASP) IMPLANT
STOCKINETTE IMPERVIOUS LG (DRAPES) ×2 IMPLANT
STRAP BODY AND KNEE 60X3 (MISCELLANEOUS) ×2 IMPLANT
STRIP CLOSURE SKIN 1/4X4 (GAUZE/BANDAGES/DRESSINGS) ×2 IMPLANT
SUT ETHILON 4-0 (SUTURE)
SUT ETHILON 4-0 FS2 18XMFL BLK (SUTURE)
SUT ETHILON 5-0 FS-2 18 BLK (SUTURE) IMPLANT
SUT MNCRL 5-0+ PC-1 (SUTURE) IMPLANT
SUT MONOCRYL 5-0 (SUTURE)
SUT VIC AB 2-0 SH 27 (SUTURE)
SUT VIC AB 2-0 SH 27XBRD (SUTURE) IMPLANT
SUT VIC AB 3-0 SH 27 (SUTURE)
SUT VIC AB 3-0 SH 27X BRD (SUTURE) IMPLANT
SUT VIC AB 4-0 FS2 27 (SUTURE) IMPLANT
SUTURE ETHLN 4-0 FS2 18XMF BLK (SUTURE) IMPLANT

## 2017-08-07 NOTE — H&P (Signed)
Pt experienced an episode of dysphagia this week and anesthesia requested further w/u prior to proceeding.  Will cancel surgery at this time and will reschedule in near future.

## 2017-08-07 NOTE — Progress Notes (Signed)
Case cancelled per anesthesia. Dr. Vickki Muff "trimmed a corn" to the right 5th toe in preop area before discharging home.

## 2017-08-07 NOTE — Anesthesia Preprocedure Evaluation (Deleted)
Anesthesia Evaluation  Patient identified by MRN, date of birth, ID band Patient awake    Reviewed: Allergy & Precautions, NPO status , Patient's Chart, lab work & pertinent test results  History of Anesthesia Complications (+) PONV and history of anesthetic complications  Airway Mallampati: I  TM Distance: >3 FB Neck ROM: Full    Dental  (+) Partial Lower, Partial Upper   Pulmonary asthma , sleep apnea and Continuous Positive Airway Pressure Ventilation , COPD,    Pulmonary exam normal breath sounds clear to auscultation       Cardiovascular Exercise Tolerance: Good negative cardio ROS Normal cardiovascular exam Rhythm:Regular Rate:Normal     Neuro/Psych Vertigo; meningioma s/p resection 2003  Neuromuscular disease (multiple sclerosis, with new-onset dysphagia over past week)    GI/Hepatic GERD  ,  Endo/Other  diabetes, Type 2  Renal/GU negative Renal ROS     Musculoskeletal  (+) Arthritis , Osteoarthritis,    Abdominal   Peds  Hematology negative hematology ROS (+)   Anesthesia Other Findings   Reproductive/Obstetrics                             Anesthesia Physical Anesthesia Plan  ASA: III  Anesthesia Plan: General   Post-op Pain Management:    Induction: Intravenous  PONV Risk Score and Plan: 3 and TIVA and Ondansetron  Airway Management Planned: Natural Airway  Additional Equipment:   Intra-op Plan:   Post-operative Plan:   Informed Consent: I have reviewed the patients History and Physical, chart, labs and discussed the procedure including the risks, benefits and alternatives for the proposed anesthesia with the patient or authorized representative who has indicated his/her understanding and acceptance.     Plan Discussed with: CRNA  Anesthesia Plan Comments: (Pt describes new-onset dysphagia over past week, causing vomiting in one instance.  Considering pt's MS,  will cancel elective procedure today and refer to PCP for further workup of dysphagia as it may represent MS flare.  Discussed with Dr. Vickki Muff and patient, both of whom understand and agree with the plan.)        Anesthesia Quick Evaluation

## 2017-08-08 ENCOUNTER — Other Ambulatory Visit: Payer: Self-pay | Admitting: Internal Medicine

## 2017-08-08 DIAGNOSIS — R1310 Dysphagia, unspecified: Secondary | ICD-10-CM

## 2017-08-12 ENCOUNTER — Ambulatory Visit
Admission: RE | Admit: 2017-08-12 | Discharge: 2017-08-12 | Disposition: A | Discharge status: Home / Self Care | Payer: Medicare Other | Source: Ambulatory Visit | Attending: Internal Medicine | Admitting: Internal Medicine

## 2017-08-12 DIAGNOSIS — R1310 Dysphagia, unspecified: Secondary | ICD-10-CM | POA: Insufficient documentation

## 2017-08-12 DIAGNOSIS — K228 Other specified diseases of esophagus: Secondary | ICD-10-CM | POA: Diagnosis not present

## 2017-12-09 DIAGNOSIS — R053 Chronic cough: Secondary | ICD-10-CM | POA: Insufficient documentation

## 2017-12-10 ENCOUNTER — Other Ambulatory Visit: Payer: Self-pay | Admitting: Internal Medicine

## 2017-12-10 DIAGNOSIS — R05 Cough: Secondary | ICD-10-CM

## 2017-12-10 DIAGNOSIS — R053 Chronic cough: Secondary | ICD-10-CM

## 2017-12-18 ENCOUNTER — Other Ambulatory Visit: Payer: Self-pay | Admitting: Internal Medicine

## 2017-12-18 DIAGNOSIS — Z1231 Encounter for screening mammogram for malignant neoplasm of breast: Secondary | ICD-10-CM

## 2017-12-25 ENCOUNTER — Ambulatory Visit
Admission: RE | Admit: 2017-12-25 | Discharge: 2017-12-25 | Disposition: A | Discharge status: Home / Self Care | Payer: Medicare Other | Source: Ambulatory Visit | Attending: Internal Medicine | Admitting: Internal Medicine

## 2017-12-25 DIAGNOSIS — R05 Cough: Secondary | ICD-10-CM | POA: Insufficient documentation

## 2017-12-25 DIAGNOSIS — I700 Atherosclerosis of aorta: Secondary | ICD-10-CM | POA: Diagnosis not present

## 2017-12-25 DIAGNOSIS — R053 Chronic cough: Secondary | ICD-10-CM

## 2018-01-03 ENCOUNTER — Ambulatory Visit
Admission: RE | Admit: 2018-01-03 | Discharge: 2018-01-03 | Disposition: A | Discharge status: Home / Self Care | Payer: Medicare Other | Source: Ambulatory Visit | Attending: Internal Medicine | Admitting: Internal Medicine

## 2018-01-03 DIAGNOSIS — Z1231 Encounter for screening mammogram for malignant neoplasm of breast: Secondary | ICD-10-CM | POA: Insufficient documentation

## 2018-01-20 NOTE — Discharge Instructions (Signed)
[facility] °[facility] ° °POST OPERATIVE INSTRUCTIONS FOR DR. TROXLER AND DR. FOWLER °[facility] ° ° °1. Take your medication as prescribed.  Pain medication should be taken only as needed. ° °2. Keep the dressing clean, dry and intact. ° °3. Keep your foot elevated above the heart level for the first 48 hours. ° °4. Walking to the bathroom and brief periods of walking are acceptable, unless we have instructed you to be non-weight bearing. ° °5. Always wear your post-op shoe when walking.  Always use your crutches if you are to be non-weight bearing. ° °6. Do not take a shower. Baths are permissible as long as the foot is kept out of the water.  ° °7. Every hour you are awake:  °- Bend your knee 15 times. °- Flex foot 15 times °- Massage calf 15 times ° °8. Call [facility] ([phone]) if any of the following problems occur: °- You develop a temperature or fever. °- The bandage becomes saturated with blood. °- Medication does not stop your pain. °- Injury of the foot occurs. °- Any symptoms of infection including redness, odor, or red streaks running from wound. ° ° °General Anesthesia, Adult, Care After °These instructions provide you with information about caring for yourself after your procedure. Your health care provider may also give you more specific instructions. Your treatment has been planned according to current medical practices, but problems sometimes occur. Call your health care provider if you have any problems or questions after your procedure. °What can I expect after the procedure? °After the procedure, it is common to have: °· Vomiting. °· A sore throat. °· Mental slowness. ° °It is common to feel: °· Nauseous. °· Cold or shivery. °· Sleepy. °· Tired. °· Sore or achy, even in parts of your body where you did not have surgery. ° °Follow these instructions at home: °For at least 24 hours after the procedure: °· Do not: °? Participate in  activities where you could fall or become injured. °? Drive. °? Use heavy machinery. °? Drink alcohol. °? Take sleeping pills or medicines that cause drowsiness. °? Make important decisions or sign legal documents. °? Take care of children on your own. °· Rest. °Eating and drinking °· If you vomit, drink water, juice, or soup when you can drink without vomiting. °· Drink enough fluid to keep your urine clear or pale yellow. °· Make sure you have little or no nausea before eating solid foods. °· Follow the diet recommended by your health care provider. °General instructions °· Have a responsible adult stay with you until you are awake and alert. °· Return to your normal activities as told by your health care provider. Ask your health care provider what activities are safe for you. °· Take over-the-counter and prescription medicines only as told by your health care provider. °· If you smoke, do not smoke without supervision. °· Keep all follow-up visits as told by your health care provider. This is important. °Contact a health care provider if: °· You continue to have nausea or vomiting at home, and medicines are not helpful. °· You cannot drink fluids or start eating again. °· You cannot urinate after 8-12 hours. °· You develop a skin rash. °· You have fever. °· You have increasing redness at the site of your procedure. °Get help right away if: °· You have difficulty breathing. °· You have chest pain. °· You have unexpected bleeding. °· You feel that you   are having a life-threatening or urgent problem. °This information is not intended to replace advice given to you by your health care provider. Make sure you discuss any questions you have with your health care provider. °Document Released: 10/15/2000 Document Revised: 12/12/2015 Document Reviewed: 06/23/2015 °Elsevier Interactive Patient Education © 2018 [employer] Inc[employer] ° °

## 2018-01-21 ENCOUNTER — Encounter: Payer: Self-pay | Admitting: *Deleted

## 2018-01-21 ENCOUNTER — Other Ambulatory Visit: Payer: Self-pay

## 2018-01-22 ENCOUNTER — Other Ambulatory Visit: Payer: Self-pay | Admitting: Podiatry

## 2018-01-29 ENCOUNTER — Encounter
Admission: RE | Disposition: A | Discharge status: Home / Self Care | Payer: Self-pay | Source: Ambulatory Visit | Attending: Podiatry

## 2018-01-29 ENCOUNTER — Ambulatory Visit: Payer: Medicare Other | Admitting: Anesthesiology

## 2018-01-29 ENCOUNTER — Ambulatory Visit
Admission: RE | Admit: 2018-01-29 | Discharge: 2018-01-29 | Disposition: A | Discharge status: Home / Self Care | Payer: Medicare Other | Source: Ambulatory Visit | Attending: Podiatry | Admitting: Podiatry

## 2018-01-29 DIAGNOSIS — G35 Multiple sclerosis: Secondary | ICD-10-CM | POA: Diagnosis not present

## 2018-01-29 DIAGNOSIS — Z7982 Long term (current) use of aspirin: Secondary | ICD-10-CM | POA: Insufficient documentation

## 2018-01-29 DIAGNOSIS — G40909 Epilepsy, unspecified, not intractable, without status epilepticus: Secondary | ICD-10-CM | POA: Insufficient documentation

## 2018-01-29 DIAGNOSIS — K210 Gastro-esophageal reflux disease with esophagitis: Secondary | ICD-10-CM | POA: Insufficient documentation

## 2018-01-29 DIAGNOSIS — M2041 Other hammer toe(s) (acquired), right foot: Secondary | ICD-10-CM | POA: Diagnosis present

## 2018-01-29 DIAGNOSIS — J4520 Mild intermittent asthma, uncomplicated: Secondary | ICD-10-CM | POA: Insufficient documentation

## 2018-01-29 DIAGNOSIS — E119 Type 2 diabetes mellitus without complications: Secondary | ICD-10-CM | POA: Diagnosis not present

## 2018-01-29 DIAGNOSIS — Z79899 Other long term (current) drug therapy: Secondary | ICD-10-CM | POA: Diagnosis not present

## 2018-01-29 DIAGNOSIS — Z8589 Personal history of malignant neoplasm of other organs and systems: Secondary | ICD-10-CM | POA: Diagnosis not present

## 2018-01-29 DIAGNOSIS — Z7984 Long term (current) use of oral hypoglycemic drugs: Secondary | ICD-10-CM | POA: Diagnosis not present

## 2018-01-29 DIAGNOSIS — M899 Disorder of bone, unspecified: Secondary | ICD-10-CM | POA: Insufficient documentation

## 2018-01-29 DIAGNOSIS — E785 Hyperlipidemia, unspecified: Secondary | ICD-10-CM | POA: Diagnosis not present

## 2018-01-29 DIAGNOSIS — G4733 Obstructive sleep apnea (adult) (pediatric): Secondary | ICD-10-CM | POA: Insufficient documentation

## 2018-01-29 HISTORY — PX: HAMMER TOE SURGERY: SHX385

## 2018-01-29 HISTORY — PX: EXCISION PARTIAL PHALANX: SHX6617

## 2018-01-29 LAB — GLUCOSE, CAPILLARY
Glucose-Capillary: 93 mg/dL (ref 70–99)
Glucose-Capillary: 92 mg/dL (ref 70–99)

## 2018-01-29 SURGERY — CORRECTION, HAMMER TOE
Anesthesia: General | Site: Foot | Laterality: Right | Wound class: "Clean "

## 2018-01-29 MED ORDER — MIDAZOLAM HCL 5 MG/5ML IJ SOLN
INTRAMUSCULAR | Status: DC | PRN
  Administered 2018-01-29 (×2): 1 mg via INTRAVENOUS

## 2018-01-29 MED ORDER — OXYCODONE HCL 5 MG PO TABS: 5.0000 mg | ORAL_TABLET | Freq: Once | ORAL | Status: DC | PRN

## 2018-01-29 MED ORDER — LIDOCAINE HCL (PF) 1 % IJ SOLN
INTRAMUSCULAR | Status: DC | PRN
  Administered 2018-01-29: 3 mL

## 2018-01-29 MED ORDER — OXYCODONE HCL 5 MG/5ML PO SOLN: 5.0000 mg | Freq: Once | ORAL | Status: DC | PRN

## 2018-01-29 MED ORDER — LACTATED RINGERS IV SOLN
1000.0000 mL | INTRAVENOUS | Status: DC
  Administered 2018-01-29: 1000 mL via INTRAVENOUS

## 2018-01-29 MED ORDER — ACETAMINOPHEN 325 MG PO TABS: 325.0000 mg | ORAL_TABLET | ORAL | Status: DC | PRN

## 2018-01-29 MED ORDER — ONDANSETRON HCL 4 MG/2ML IJ SOLN
INTRAMUSCULAR | Status: DC | PRN
  Administered 2018-01-29: 4 mg via INTRAVENOUS

## 2018-01-29 MED ORDER — OXYCODONE-ACETAMINOPHEN 5-325 MG PO TABS: 1.0000 | ORAL_TABLET | Freq: Three times a day (TID) | ORAL | 0 refills | Status: AC | PRN

## 2018-01-29 MED ORDER — DEXAMETHASONE SODIUM PHOSPHATE 4 MG/ML IJ SOLN
INTRAMUSCULAR | Status: DC | PRN
  Administered 2018-01-29: 4 mg via INTRAVENOUS

## 2018-01-29 MED ORDER — POVIDONE-IODINE 7.5 % EX SOLN: Freq: Once | CUTANEOUS | Status: DC

## 2018-01-29 MED ORDER — BUPIVACAINE HCL (PF) 0.5 % IJ SOLN
INTRAMUSCULAR | Status: DC | PRN
  Administered 2018-01-29: 3 mL

## 2018-01-29 MED ORDER — HYDROMORPHONE HCL 1 MG/ML IJ SOLN: 0.2500 mg | INTRAMUSCULAR | Status: DC | PRN

## 2018-01-29 MED ORDER — LIDOCAINE HCL (CARDIAC) PF 100 MG/5ML IV SOSY
PREFILLED_SYRINGE | INTRAVENOUS | Status: DC | PRN
  Administered 2018-01-29: 30 mg via INTRATRACHEAL

## 2018-01-29 MED ORDER — ONDANSETRON HCL 4 MG/2ML IJ SOLN: 4.0000 mg | Freq: Once | INTRAMUSCULAR | Status: DC | PRN

## 2018-01-29 MED ORDER — LIDOCAINE-EPINEPHRINE 1 %-1:100000 IJ SOLN
INTRAMUSCULAR | Status: DC | PRN
  Administered 2018-01-29: 2 mL

## 2018-01-29 MED ORDER — PROPOFOL 10 MG/ML IV BOLUS
INTRAVENOUS | Status: DC | PRN
  Administered 2018-01-29: 130 mg via INTRAVENOUS

## 2018-01-29 MED ORDER — CEFAZOLIN SODIUM-DEXTROSE 2-4 GM/100ML-% IV SOLN
2.0000 g | INTRAVENOUS | Status: AC
  Administered 2018-01-29: 2 g via INTRAVENOUS

## 2018-01-29 MED ORDER — GLYCOPYRROLATE 0.2 MG/ML IJ SOLN
INTRAMUSCULAR | Status: DC | PRN
  Administered 2018-01-29: 0.1 mg via INTRAVENOUS

## 2018-01-29 MED ORDER — FENTANYL CITRATE (PF) 100 MCG/2ML IJ SOLN
INTRAMUSCULAR | Status: DC | PRN
  Administered 2018-01-29: 25 ug via INTRAVENOUS

## 2018-01-29 MED ORDER — ACETAMINOPHEN 160 MG/5ML PO SOLN: 325.0000 mg | ORAL | Status: DC | PRN

## 2018-01-29 SURGICAL SUPPLY — 33 items
BANDAGE ELASTIC 4 VELCRO NS (GAUZE/BANDAGES/DRESSINGS) ×2 IMPLANT
BENZOIN TINCTURE PRP APPL 2/3 (GAUZE/BANDAGES/DRESSINGS) ×2 IMPLANT
BLADE OSC/SAGITTAL MD 5.5X18 (BLADE) ×1 IMPLANT
BLADE SURG 15 STRL LF DISP TIS (BLADE) IMPLANT
BLADE SURG 15 STRL SS (BLADE) ×1
BNDG COHESIVE 4X5 TAN STRL (GAUZE/BANDAGES/DRESSINGS) ×2 IMPLANT
BNDG ESMARK 4X12 TAN STRL LF (GAUZE/BANDAGES/DRESSINGS) ×2 IMPLANT
BNDG GAUZE 4.5X4.1 6PLY STRL (MISCELLANEOUS) ×2 IMPLANT
BNDG STRETCH 4X75 STRL LF (GAUZE/BANDAGES/DRESSINGS) ×2 IMPLANT
BUR SIDE CUT 44.8 STRL (BURR) IMPLANT
BURR SIDE CUT 44.8 STRL (BURR) ×2
CANISTER SUCT 1200ML W/VALVE (MISCELLANEOUS) ×2 IMPLANT
COVER LIGHT HANDLE UNIVERSAL (MISCELLANEOUS) ×4 IMPLANT
CUFF TOURN SGL QUICK 18 (TOURNIQUET CUFF) ×1 IMPLANT
DRAPE FLUOR MINI C-ARM 54X84 (DRAPES) ×2 IMPLANT
DURAPREP 26ML APPLICATOR (WOUND CARE) ×2 IMPLANT
ELECT REM PT RETURN 9FT ADLT (ELECTROSURGICAL) ×2
ELECTRODE REM PT RTRN 9FT ADLT (ELECTROSURGICAL) ×1 IMPLANT
GAUZE PETRO XEROFOAM 1X8 (MISCELLANEOUS) ×2 IMPLANT
GAUZE SPONGE 4X4 12PLY STRL (GAUZE/BANDAGES/DRESSINGS) ×2 IMPLANT
GLOVE BIO SURGEON STRL SZ7.5 (GLOVE) ×2 IMPLANT
GLOVE INDICATOR 8.0 STRL GRN (GLOVE) ×2 IMPLANT
GOWN STRL REUS W/ TWL LRG LVL3 (GOWN DISPOSABLE) ×2 IMPLANT
GOWN STRL REUS W/TWL LRG LVL3 (GOWN DISPOSABLE) ×2
KIT TURNOVER KIT A (KITS) ×2 IMPLANT
NS IRRIG 500ML POUR BTL (IV SOLUTION) ×2 IMPLANT
PACK EXTREMITY ARMC (MISCELLANEOUS) ×2 IMPLANT
PENCIL SMOKE EVACUATOR (MISCELLANEOUS) ×2 IMPLANT
PIN BALLS 3/8 F/.045 WIRE (MISCELLANEOUS) ×2 IMPLANT
STOCKINETTE IMPERVIOUS LG (DRAPES) ×2 IMPLANT
STRIP CLOSURE SKIN 1/4X4 (GAUZE/BANDAGES/DRESSINGS) ×2 IMPLANT
SUT ETHILON 5-0 FS-2 18 BLK (SUTURE) ×1 IMPLANT
SUT VIC AB 4-0 FS2 27 (SUTURE) ×1 IMPLANT

## 2018-01-29 NOTE — H&P (Signed)
HISTORY AND PHYSICAL INTERVAL NOTE:  01/29/2018  11:50 AM  Caroline Hall  has presented today for surgery, with the diagnosis of Hammer toe of right foot.  The various methods of treatment have been discussed with the patient.  No guarantees were given.  After consideration of risks, benefits and other options for treatment, the patient has consented to surgery.  I have reviewed the patients' chart and labs.    Patient Vitals for the past 24 hrs:  BP Temp Temp src Pulse SpO2 Weight  01/29/18 1103 (!) 144/64 (!) 97.5 F (36.4 C) Temporal 64 98 % 73.9 kg (163 lb)    A history and physical examination was performed in my office.  The patient was reexamined.  There have been no changes to this history and physical examination.  Samara Deist A

## 2018-01-29 NOTE — Op Note (Signed)
Operative note   Surgeon:Amadi Frady Lawyer: None    Preop diagnosis: 1.  Hammertoe right fifth toe 2.  Exostosis right fourth toe    Postop diagnosis: Same    Procedure: 1.  Arthroplasty right fifth toe 2.  Exostectomy PIPJ right fourth toe    EBL: Minimal    Anesthesia:local and general    Hemostasis: Lidocaine with epinephrine infiltrated along the incision sites    Specimen: None    Complications: None    Operative indications:Caroline Hall is an 72 y.o. that presents today for surgical intervention.  The risks/benefits/alternatives/complications have been discussed and consent has been given.    Procedure:  Patient was brought into the OR and placed on the operating table in thesupine position. After anesthesia was obtained theright lower extremity was prepped and draped in usual sterile fashion.  Attention was directed to the dorsal aspect of the PIPJ of the right fifth toe where longitudinal incision was performed.  Sharp and blunt dissection carried down to the extensor tendon.  Tenotomy was performed.  This was reflected proximally.  This exposed the head of the proximal phalanx.  This was then excised with a power saw.  Wound was flushed with copious amounts of irrigation.  A second incision was made at the distal medial condyle of the fifth toe on the distal phalanx.  Blunt dissection carried down to the distal medial condyle.  With a sidecutting bur this was smoothed deep to the hyperkeratotic lesion.  Both wounds were flushed with copious amounts of irrigation.  The extensor tendon was reapproximated with a 4-0 Vicryl and the skin reapproximated to both areas with a 5-0 nylon.  Attention was then directed to the fourth toe where a small 1 cm incision was made over the PIPJ just lateral to midline.  Sharp and blunt dissection carried down to the PIPJ.  The lateral aspect of the head of the proximal phalanx was noted.  This was transected and smoothed with a power  saw.  This was then removed.  The wound was flushed with copious amounts of irrigation.  Closure was performed with a 4-0 Vicryl for the subtenons tissue and a 5-0 nylon for skin.  Patient was placed in a well compressive sterile dressing.    Patient tolerated the procedure and anesthesia well.  Was transported from the OR to the PACU with all vital signs stable and vascular status intact. To be discharged per routine protocol.  Will follow up in approximately 1 week in the outpatient clinic.

## 2018-01-29 NOTE — Transfer of Care (Signed)
Immediate Anesthesia Transfer of Care Note  Patient: Caroline Hall  Procedure(s) Performed: HAMMER TOE CORRECTION-5TH TOE (Right Foot) EXCISION PARTIAL PHALANX-4TH TOE(HEMIPHALANGECTOMY) (Right Foot)  Patient Location: PACU  Anesthesia Type: General  Level of Consciousness: awake, alert  and patient cooperative  Airway and Oxygen Therapy: Patient Spontanous Breathing and Patient connected to supplemental oxygen  Post-op Assessment: Post-op Vital signs reviewed, Patient's Cardiovascular Status Stable, Respiratory Function Stable, Patent Airway and No signs of Nausea or vomiting  Post-op Vital Signs: Reviewed and stable  Complications: No apparent anesthesia complications

## 2018-01-29 NOTE — Anesthesia Postprocedure Evaluation (Signed)
Anesthesia Post Note  Patient: Caroline Hall  Procedure(s) Performed: HAMMER TOE CORRECTION-5TH TOE (Right Foot) EXCISION PARTIAL PHALANX-4TH TOE(HEMIPHALANGECTOMY) (Right Foot)  Patient location during evaluation: PACU Anesthesia Type: General Level of consciousness: awake and alert Pain management: pain level controlled Vital Signs Assessment: post-procedure vital signs reviewed and stable Respiratory status: spontaneous breathing, nonlabored ventilation, respiratory function stable and patient connected to nasal cannula oxygen Cardiovascular status: blood pressure returned to baseline and stable Postop Assessment: no apparent nausea or vomiting Anesthetic complications: no    Trecia Rogers

## 2018-01-29 NOTE — Anesthesia Preprocedure Evaluation (Signed)
Anesthesia Evaluation  Patient identified by MRN, date of birth, ID band Patient awake    Reviewed: Allergy & Precautions, H&P , NPO status , Patient's Chart, lab work & pertinent test results, reviewed documented beta blocker date and time   History of Anesthesia Complications (+) PONV and history of anesthetic complications  Airway Mallampati: II  TM Distance: >3 FB Neck ROM: full    Dental no notable dental hx.  Partials left at home :   Pulmonary asthma , sleep apnea ,    Pulmonary exam normal breath sounds clear to auscultation       Cardiovascular Exercise Tolerance: Good negative cardio ROS Normal cardiovascular exam Rhythm:regular Rate:Normal     Neuro/Psych Multiple Sclerosis  negative psych ROS   GI/Hepatic Neg liver ROS, GERD  ,  Endo/Other  diabetes, Type 2, Oral Hypoglycemic Agents  Renal/GU negative Renal ROS  negative genitourinary   Musculoskeletal   Abdominal   Peds  Hematology negative hematology ROS (+)   Anesthesia Other Findings   Reproductive/Obstetrics negative OB ROS                             Anesthesia Physical Anesthesia Plan  ASA: III  Anesthesia Plan: General   Post-op Pain Management:    Induction:   PONV Risk Score and Plan:   Airway Management Planned:   Additional Equipment:   Intra-op Plan:   Post-operative Plan:   Informed Consent: I have reviewed the patients History and Physical, chart, labs and discussed the procedure including the risks, benefits and alternatives for the proposed anesthesia with the patient or authorized representative who has indicated his/her understanding and acceptance.   Dental Advisory Given  Plan Discussed with: CRNA and Anesthesiologist  Anesthesia Plan Comments:         Anesthesia Quick Evaluation

## 2018-01-29 NOTE — Anesthesia Procedure Notes (Signed)
Procedure Name: LMA Insertion Date/Time: 01/29/2018 12:08 PM Performed by: Cameron Ali, CRNA Pre-anesthesia Checklist: Patient identified, Emergency Drugs available, Suction available, Timeout performed and Patient being monitored Patient Re-evaluated:Patient Re-evaluated prior to induction Oxygen Delivery Method: Circle system utilized Preoxygenation: Pre-oxygenation with 100% oxygen Induction Type: IV induction LMA: LMA inserted LMA Size: 3.0 Number of attempts: 1 Placement Confirmation: positive ETCO2 and breath sounds checked- equal and bilateral Tube secured with: Tape Dental Injury: Teeth and Oropharynx as per pre-operative assessment

## 2018-01-30 ENCOUNTER — Encounter: Payer: Self-pay | Admitting: Podiatry

## 2018-03-19 ENCOUNTER — Encounter: Payer: Self-pay | Admitting: *Deleted

## 2018-03-19 ENCOUNTER — Other Ambulatory Visit: Payer: Self-pay

## 2018-03-19 NOTE — Discharge Instructions (Signed)
INSTRUCTIONS FOLLOWING OCULOPLASTIC SURGERY °AMY M. FOWLER, MD ° °AFTER YOUR EYE SURGERY, THER ARE MANY THINGS THWIHC YOU, THE PATIENT, CAN DO TO ASSURE THE BEST POSSIBLE RESULT FROM YOUR OPERATION.  THIS SHEET SHOULD BE REFERRED TO WHENEVER QUESTIONS ARISE.  IF THERE ARE ANY QUESTIONS NOT ANSWERED HERE, DO NOT HESITATE TO CALL OUR OFFICE AT [phone] OR [phone].  THERE IS ALWAYS [facility] AVAILABLE TO CALL IF QUESTIONS OR PROBLEMS ARISE. ° °VISION: Your vision may be blurred and out of focus after surgery until you are able to stop using your ointment, swelling resolves and your eye(s) heal. This may take 1 to 2 weeks at the least.  If your vision becomes gradually more dim or dark, this is not normal and you need to call our office immediately. ° °EYE CARE: For the first 48 hours after surgery, use ice packs frequently - “20 minutes on, 20 minutes off” - to help reduce swelling and bruising.  Small bags of frozen peas or corn make good ice packs along with cloths soaked in ice water.  If you are wearing a patch or other type of dressing following surgery, keep this on for the amount of time specified by your doctor.  For the first week following surgery, you will need to treat your stitches with great care.  If is OK to shower, but take care to not allow soapy water to run into your eye(s) to help reduce changes of infection.  You may gently clean the eyelashes and around the eye(s) with cotton balls and sterile water, BUT DO NOT RUB THE STITCHES VIGOROUSLY.  Keeping your stitches moist with ointment will help promote healing with minimal scar formation. ° °ACTIVITY: When you leave the surgery center, you should go home, rest and be inactive.  The eye(s) may feel scratchy and keeping the eyes closed will allow for faster healing.  The first week following surgery, avoid straining (anything making the face turn red) or lifting over 20 pounds.  Additionally, avoid bending which causes your head to go below  your waist.  Using your eyes will NOT harm them, so feel free to read, watch television, use the computer, etc as desired.  Driving depends on each individual, so check with your doctor if you have questions about driving. ° °MEDICATIONS:  You will be given a prescription for an ointment to use 4 times a day on your stitches.  You can use the ointment in your eyes if they feel scratchy or irritated.  If you eyelid(s) don’t close completely when you sleep, put some ointment in your eyes before bedtime. ° °EMERGENCY: If you experience SEVERE EYE PAIN OR HEADACHE UNRELIEVED BY TYLENOL OR PERCOCET, NAUSEA OR VOMITING, WORSENING REDNESS, OR WORSENING VISION (ESPECIALLY VISION THAT WA INITIALLY BETTER) CALL [phone] OR [phone] DURING BUSINESS HOURS OR AFTER HOURS. ° °General Anesthesia, Adult, Care After °These instructions provide you with information about caring for yourself after your procedure. Your health care provider may also give you more specific instructions. Your treatment has been planned according to current medical practices, but problems sometimes occur. Call your health care provider if you have any problems or questions after your procedure. °What can I expect after the procedure? °After the procedure, it is common to have: °· Vomiting. °· A sore throat. °· Mental slowness. ° °It is common to feel: °· Nauseous. °· Cold or shivery. °· Sleepy. °· Tired. °· Sore or achy, even in parts of your body where you did not have surgery. ° °  Follow these instructions at home: °For at least 24 hours after the procedure: °· Do not: °? Participate in activities where you could fall or become injured. °? Drive. °? Use heavy machinery. °? Drink alcohol. °? Take sleeping pills or medicines that cause drowsiness. °? Make important decisions or sign legal documents. °? Take care of children on your own. °· Rest. °Eating and drinking °· If you vomit, drink water, juice, or soup when you can drink without  vomiting. °· Drink enough fluid to keep your urine clear or pale yellow. °· Make sure you have little or no nausea before eating solid foods. °· Follow the diet recommended by your health care provider. °General instructions °· Have a responsible adult stay with you until you are awake and alert. °· Return to your normal activities as told by your health care provider. Ask your health care provider what activities are safe for you. °· Take over-the-counter and prescription medicines only as told by your health care provider. °· If you smoke, do not smoke without supervision. °· Keep all follow-up visits as told by your health care provider. This is important. °Contact a health care provider if: °· You continue to have nausea or vomiting at home, and medicines are not helpful. °· You cannot drink fluids or start eating again. °· You cannot urinate after 8-12 hours. °· You develop a skin rash. °· You have fever. °· You have increasing redness at the site of your procedure. °Get help right away if: °· You have difficulty breathing. °· You have chest pain. °· You have unexpected bleeding. °· You feel that you are having a life-threatening or urgent problem. °This information is not intended to replace advice given to you by your health care provider. Make sure you discuss any questions you have with your health care provider. °Document Released: 10/15/2000 Document Revised: 12/12/2015 Document Reviewed: 06/23/2015 °Elsevier Interactive Patient Education © 2018 [employer] ° °

## 2018-03-25 ENCOUNTER — Encounter
Admission: RE | Disposition: A | Discharge status: Home / Self Care | Payer: Self-pay | Source: Ambulatory Visit | Attending: Ophthalmology

## 2018-03-25 ENCOUNTER — Ambulatory Visit
Admission: RE | Admit: 2018-03-25 | Discharge: 2018-03-25 | Disposition: A | Discharge status: Home / Self Care | Payer: Medicare Other | Source: Ambulatory Visit | Attending: Ophthalmology | Admitting: Ophthalmology

## 2018-03-25 ENCOUNTER — Ambulatory Visit: Payer: Medicare Other | Admitting: Anesthesiology

## 2018-03-25 DIAGNOSIS — Z7982 Long term (current) use of aspirin: Secondary | ICD-10-CM | POA: Insufficient documentation

## 2018-03-25 DIAGNOSIS — G4730 Sleep apnea, unspecified: Secondary | ICD-10-CM | POA: Diagnosis not present

## 2018-03-25 DIAGNOSIS — E7800 Pure hypercholesterolemia, unspecified: Secondary | ICD-10-CM | POA: Diagnosis not present

## 2018-03-25 DIAGNOSIS — Z79899 Other long term (current) drug therapy: Secondary | ICD-10-CM | POA: Diagnosis not present

## 2018-03-25 DIAGNOSIS — H02831 Dermatochalasis of right upper eyelid: Secondary | ICD-10-CM | POA: Diagnosis not present

## 2018-03-25 DIAGNOSIS — G35 Multiple sclerosis: Secondary | ICD-10-CM | POA: Insufficient documentation

## 2018-03-25 DIAGNOSIS — J449 Chronic obstructive pulmonary disease, unspecified: Secondary | ICD-10-CM | POA: Insufficient documentation

## 2018-03-25 DIAGNOSIS — E119 Type 2 diabetes mellitus without complications: Secondary | ICD-10-CM | POA: Insufficient documentation

## 2018-03-25 DIAGNOSIS — Z7984 Long term (current) use of oral hypoglycemic drugs: Secondary | ICD-10-CM | POA: Insufficient documentation

## 2018-03-25 DIAGNOSIS — Z85828 Personal history of other malignant neoplasm of skin: Secondary | ICD-10-CM | POA: Diagnosis not present

## 2018-03-25 DIAGNOSIS — H02834 Dermatochalasis of left upper eyelid: Secondary | ICD-10-CM | POA: Insufficient documentation

## 2018-03-25 HISTORY — PX: BROW LIFT: SHX178

## 2018-03-25 LAB — GLUCOSE, CAPILLARY
Glucose-Capillary: 136 mg/dL — ABNORMAL HIGH (ref 70–99)
Glucose-Capillary: 105 mg/dL — ABNORMAL HIGH (ref 70–99)

## 2018-03-25 SURGERY — BLEPHAROPLASTY
Anesthesia: Monitor Anesthesia Care | Site: Eye | Laterality: Bilateral | Wound class: Clean

## 2018-03-25 MED ORDER — ERYTHROMYCIN 5 MG/GM OP OINT: TOPICAL_OINTMENT | OPHTHALMIC | 2 refills | Status: AC

## 2018-03-25 MED ORDER — TETRACAINE HCL 0.5 % OP SOLN
OPHTHALMIC | Status: DC | PRN
  Administered 2018-03-25: 2 [drp] via OPHTHALMIC

## 2018-03-25 MED ORDER — BSS IO SOLN
INTRAOCULAR | Status: DC | PRN
  Administered 2018-03-25: 15 mL

## 2018-03-25 MED ORDER — ERYTHROMYCIN 5 MG/GM OP OINT
TOPICAL_OINTMENT | OPHTHALMIC | Status: DC | PRN
  Administered 2018-03-25: 1 via OPHTHALMIC

## 2018-03-25 MED ORDER — PROPOFOL 500 MG/50ML IV EMUL
INTRAVENOUS | Status: DC | PRN
  Administered 2018-03-25: 25 ug/kg/min via INTRAVENOUS

## 2018-03-25 MED ORDER — ALFENTANIL 500 MCG/ML IJ INJ
INJECTION | INTRAVENOUS | Status: DC | PRN
  Administered 2018-03-25: 400 ug via INTRAVENOUS
  Administered 2018-03-25: 100 ug via INTRAVENOUS

## 2018-03-25 MED ORDER — LIDOCAINE-EPINEPHRINE 2 %-1:100000 IJ SOLN
INTRAMUSCULAR | Status: DC | PRN
  Administered 2018-03-25: 2 mL via OPHTHALMIC

## 2018-03-25 MED ORDER — LIDOCAINE HCL (CARDIAC) PF 100 MG/5ML IV SOSY
PREFILLED_SYRINGE | INTRAVENOUS | Status: DC | PRN
  Administered 2018-03-25: 40 mg via INTRAVENOUS

## 2018-03-25 MED ORDER — LACTATED RINGERS IV SOLN
INTRAVENOUS | Status: DC
  Administered 2018-03-25: 09:00:00 via INTRAVENOUS

## 2018-03-25 MED ORDER — ACETAMINOPHEN 325 MG PO TABS: 325.0000 mg | ORAL_TABLET | Freq: Once | ORAL | Status: DC

## 2018-03-25 MED ORDER — MIDAZOLAM HCL 2 MG/2ML IJ SOLN
INTRAMUSCULAR | Status: DC | PRN
  Administered 2018-03-25 (×2): 1 mg via INTRAVENOUS

## 2018-03-25 MED ORDER — ACETAMINOPHEN 160 MG/5ML PO SOLN: 325.0000 mg | Freq: Once | ORAL | Status: DC

## 2018-03-25 SURGICAL SUPPLY — 35 items
APPLICATOR COTTON TIP WD 3 STR (MISCELLANEOUS) ×2 IMPLANT
BLADE SURG 15 STRL LF DISP TIS (BLADE) ×1 IMPLANT
BLADE SURG 15 STRL SS (BLADE) ×1
CORD BIP STRL DISP 12FT (MISCELLANEOUS) ×2 IMPLANT
DRAPE HEAD BAR (DRAPES) ×2 IMPLANT
GAUZE SPONGE 4X4 12PLY STRL (GAUZE/BANDAGES/DRESSINGS) ×2 IMPLANT
GAUZE SPONGE NON-WVN 2X2 STRL (MISCELLANEOUS) ×10 IMPLANT
GLOVE SURG LX 7.0 MICRO (GLOVE) ×2
GLOVE SURG LX STRL 7.0 MICRO (GLOVE) ×2 IMPLANT
MARKER SKIN XFINE TIP W/RULER (MISCELLANEOUS) ×2 IMPLANT
NEEDLE FILTER BLUNT 18X 1/2SAF (NEEDLE) ×1
NEEDLE FILTER BLUNT 18X1 1/2 (NEEDLE) ×1 IMPLANT
NEEDLE HYPO 30X.5 LL (NEEDLE) ×4 IMPLANT
PACK DRAPE NASAL/ENT (PACKS) ×2 IMPLANT
SOL PREP PVP 2OZ (MISCELLANEOUS) ×2
SOLUTION PREP PVP 2OZ (MISCELLANEOUS) ×1 IMPLANT
SPONGE VERSALON 2X2 STRL (MISCELLANEOUS) ×10
SUT CHROMIC 4-0 (SUTURE)
SUT CHROMIC 4-0 M2 12X2 ARM (SUTURE)
SUT CHROMIC 5 0 P 3 (SUTURE) IMPLANT
SUT ETHILON 4 0 CL P 3 (SUTURE) IMPLANT
SUT MERSILENE 4-0 S-2 (SUTURE) IMPLANT
SUT PLAIN GUT (SUTURE) ×2 IMPLANT
SUT PROLENE 5 0 P 3 (SUTURE) IMPLANT
SUT PROLENE 6 0 P 1 18 (SUTURE) IMPLANT
SUT SILK 4 0 G 3 (SUTURE) IMPLANT
SUT VIC AB 5-0 P-3 18X BRD (SUTURE) IMPLANT
SUT VIC AB 5-0 P3 18 (SUTURE)
SUT VICRYL 6-0  S14 CTD (SUTURE)
SUT VICRYL 6-0 S14 CTD (SUTURE) IMPLANT
SUT VICRYL 7 0 TG140 8 (SUTURE) IMPLANT
SUTURE CHRMC 4-0 M2 12X2 ARM (SUTURE) IMPLANT
SYR 10ML LL (SYRINGE) ×2 IMPLANT
SYR 3ML LL SCALE MARK (SYRINGE) ×2 IMPLANT
WATER STERILE IRR 250ML POUR (IV SOLUTION) ×2 IMPLANT

## 2018-03-25 NOTE — Anesthesia Preprocedure Evaluation (Signed)
Anesthesia Evaluation  Patient identified by MRN, date of birth, ID band Patient awake    Reviewed: Allergy & Precautions, H&P , NPO status , Patient's Chart, lab work & pertinent test results  Airway Mallampati: II  TM Distance: >3 FB Neck ROM: full    Dental no notable dental hx.    Pulmonary asthma , sleep apnea and Continuous Positive Airway Pressure Ventilation ,    Pulmonary exam normal breath sounds clear to auscultation       Cardiovascular Normal cardiovascular exam Rhythm:regular Rate:Normal     Neuro/Psych MS    GI/Hepatic GERD  ,  Endo/Other  diabetes, Type 2, Oral Hypoglycemic Agents  Renal/GU      Musculoskeletal   Abdominal   Peds  Hematology   Anesthesia Other Findings   Reproductive/Obstetrics                             Anesthesia Physical Anesthesia Plan  ASA: III  Anesthesia Plan: MAC   Post-op Pain Management:    Induction:   PONV Risk Score and Plan: 2 and Ondansetron, Midazolam and Treatment may vary due to age or medical condition  Airway Management Planned:   Additional Equipment:   Intra-op Plan:   Post-operative Plan:   Informed Consent: I have reviewed the patients History and Physical, chart, labs and discussed the procedure including the risks, benefits and alternatives for the proposed anesthesia with the patient or authorized representative who has indicated his/her understanding and acceptance.     Plan Discussed with: CRNA  Anesthesia Plan Comments:         Anesthesia Quick Evaluation

## 2018-03-25 NOTE — H&P (Signed)
See the history and physical completed at [facility] on 03/19/18 and scanned into the chart.

## 2018-03-25 NOTE — Op Note (Signed)
Preoperative Diagnosis:  Visually significant dermatochalasis bilateral upper Eyelid(s)  Postoperative Diagnosis:  Same.  Procedure(s) Performed:   Upper eyelid blepharoplasty with excess skin excision bilateral upper Eyelid(s)  Teaching Surgeon: Philis Pique. Vickki Muff, M.D.  Assistants: none  Anesthesia: MAC  Specimens: None.  Estimated Blood Loss: Minimal.  Complications: None.  Operative Findings: None Dictated  Procedure:   Allergies were reviewed and the patient is allergic to Erythromycin - oral; Naproxen; Omeprazole; and Other.   After the risks, benefits, complications and alternatives were discussed with the patient, appropriate informed consent was obtained and the patient was brought to the operating suite. The patient was reclined supine and a timeout was conducted.  The patient was then sedated.  Local anesthetic consisting of a 50-50 mixture of 2% lidocaine with epinephrine and 0.75% bupivacaine with added Hylenex was injected subcutaneously to both upper eyelid(s). After adequate local was instilled, the patient was prepped and draped in the usual sterile fashion for eyelid surgery.   Attention was turned to the upper eyelids. A 8 mm upper eyelid crease incision line was marked with calipers on both upper eyelid(s).  A pinch test was used to estimate the amount of excess skin to remove and this was marked in standard blepharoplasty style fashion. Attention was turned to the right upper eyelid. A #15 blade was used to open the premarked incision line. A 10 and muscle flap was excised and hemostasis was obtained with bipolar cautery.   A buttonhole was created medially in orbicularis and orbital septum to reveal the medial fat pocket. This was dissected free from fascial attachments, cauterized towards the pedicle base and excised to produce a nice flattening of the medial corner of the upper eyelid.  Attention was then turned to the opposite eyelid where the same procedure  was performed in the same manner. Hemostasis was obtained with bipolar cautery throughout. All incisions were then closed with a combination of running and interrupted 6-0 fast absorbing plain suture. The patient tolerated the procedure well.  Erythromycin ophthalmic ointment was applied to her incision sites, followed by ice packs. She was taken to the recovery area where she recovered without difficulty.  Post-Op Plan/Instructions:  The patient was instructed to use ice packs frequently for the next 48 hours. She was instructed to use erythromycin ophthalmic ointment on her incisions 4 times a day for the next 12 to 14 days.  If a contact allergy develops she was instructed to switch to Aquaphor ointment.   She already had a prescription for Percocet for pain control should Tylenol not be effective so a new prescription was not issued. She was asked to to follow up in 2 weeks' time at the [facility] in [place], [place] or sooner as needed for problems.  Madelina Sanda M. Vickki Muff, M.D. Attending,Ophthalmology

## 2018-03-25 NOTE — Transfer of Care (Signed)
Immediate Anesthesia Transfer of Care Note  Patient: Caroline Hall  Procedure(s) Performed: BLEPHAROPLASTY UPPER EYELID WITH EXCESS SKIN DIABETIC (Bilateral Eye)  Patient Location: PACU  Anesthesia Type: MAC  Level of Consciousness: awake, alert  and patient cooperative  Airway and Oxygen Therapy: Patient Spontanous Breathing and Patient connected to supplemental oxygen  Post-op Assessment: Post-op Vital signs reviewed, Patient's Cardiovascular Status Stable, Respiratory Function Stable, Patent Airway and No signs of Nausea or vomiting  Post-op Vital Signs: Reviewed and stable  Complications: No apparent anesthesia complications

## 2018-03-25 NOTE — Interval H&P Note (Signed)
History and Physical Interval Note:  03/25/2018 8:56 AM  Caroline Hall  has presented today for surgery, with the diagnosis of H02.831 H02.834 DERMATOCHALASIS  The various methods of treatment have been discussed with the patient and family. After consideration of risks, benefits and other options for treatment, the patient has consented to  Procedure(s) with comments: BLEPHAROPLASTY UPPER EYELID WITH EXCESS SKIN DIABETIC (Bilateral) - Diabetic - oral meds sleep apnea as a surgical intervention .  The patient's history has been reviewed, patient examined, no change in status, stable for surgery.  I have reviewed the patient's chart and labs.  Questions were answered to the patient's satisfaction.     Vickki Muff, Amy M

## 2018-03-25 NOTE — Anesthesia Procedure Notes (Signed)
Performed by: Britanie Harshman, CRNA Pre-anesthesia Checklist: Patient identified, Emergency Drugs available, Suction available, Timeout performed and Patient being monitored Patient Re-evaluated:Patient Re-evaluated prior to induction Oxygen Delivery Method: Nasal cannula Placement Confirmation: positive ETCO2       

## 2018-03-25 NOTE — Anesthesia Postprocedure Evaluation (Signed)
Anesthesia Post Note  Patient: Caroline Hall  Procedure(s) Performed: BLEPHAROPLASTY UPPER EYELID WITH EXCESS SKIN DIABETIC (Bilateral Eye)  Patient location during evaluation: PACU Anesthesia Type: MAC Level of consciousness: awake and alert and oriented Pain management: satisfactory to patient Vital Signs Assessment: post-procedure vital signs reviewed and stable Respiratory status: spontaneous breathing, nonlabored ventilation and respiratory function stable Cardiovascular status: blood pressure returned to baseline and stable Postop Assessment: Adequate PO intake and No signs of nausea or vomiting Anesthetic complications: no    Raliegh Ip

## 2018-03-27 ENCOUNTER — Encounter: Payer: Self-pay | Admitting: Ophthalmology

## 2018-08-25 ENCOUNTER — Other Ambulatory Visit: Payer: Self-pay | Admitting: Neurology

## 2018-08-25 DIAGNOSIS — G35 Multiple sclerosis: Secondary | ICD-10-CM

## 2018-09-04 ENCOUNTER — Ambulatory Visit
Admission: RE | Admit: 2018-09-04 | Discharge: 2018-09-04 | Disposition: A | Discharge status: Home / Self Care | Payer: Medicare Other | Source: Ambulatory Visit | Attending: Neurology | Admitting: Neurology

## 2018-09-04 DIAGNOSIS — G35 Multiple sclerosis: Secondary | ICD-10-CM

## 2018-09-04 LAB — POCT I-STAT CREATININE: Creatinine, Ser: 0.9 mg/dL (ref 0.44–1.00)

## 2018-09-04 MED ORDER — GADOBUTROL 1 MMOL/ML IV SOLN
7.0000 mL | Freq: Once | INTRAVENOUS | Status: AC | PRN
  Administered 2018-09-04: 7 mL via INTRAVENOUS

## 2018-12-11 DIAGNOSIS — I10 Essential (primary) hypertension: Secondary | ICD-10-CM | POA: Insufficient documentation

## 2018-12-24 ENCOUNTER — Other Ambulatory Visit: Payer: Self-pay | Admitting: Internal Medicine

## 2018-12-24 DIAGNOSIS — Z1231 Encounter for screening mammogram for malignant neoplasm of breast: Secondary | ICD-10-CM

## 2019-02-06 ENCOUNTER — Ambulatory Visit
Admission: RE | Admit: 2019-02-06 | Discharge: 2019-02-06 | Disposition: A | Discharge status: Home / Self Care | Payer: Medicare Other | Source: Ambulatory Visit | Attending: Internal Medicine | Admitting: Internal Medicine

## 2019-02-06 ENCOUNTER — Other Ambulatory Visit: Payer: Self-pay

## 2019-02-06 DIAGNOSIS — Z1231 Encounter for screening mammogram for malignant neoplasm of breast: Secondary | ICD-10-CM | POA: Diagnosis present

## 2019-12-10 ENCOUNTER — Other Ambulatory Visit: Payer: Self-pay | Admitting: Internal Medicine

## 2019-12-10 DIAGNOSIS — Z1231 Encounter for screening mammogram for malignant neoplasm of breast: Secondary | ICD-10-CM

## 2019-12-10 DIAGNOSIS — E118 Type 2 diabetes mellitus with unspecified complications: Secondary | ICD-10-CM | POA: Insufficient documentation

## 2020-02-23 ENCOUNTER — Ambulatory Visit
Admission: RE | Admit: 2020-02-23 | Discharge: 2020-02-23 | Disposition: A | Discharge status: Home / Self Care | Payer: Medicare Other | Source: Ambulatory Visit | Attending: Internal Medicine | Admitting: Internal Medicine

## 2020-02-23 DIAGNOSIS — Z1231 Encounter for screening mammogram for malignant neoplasm of breast: Secondary | ICD-10-CM | POA: Insufficient documentation

## 2020-11-28 ENCOUNTER — Other Ambulatory Visit: Payer: Self-pay | Admitting: Internal Medicine

## 2020-11-28 DIAGNOSIS — Z1231 Encounter for screening mammogram for malignant neoplasm of breast: Secondary | ICD-10-CM

## 2021-03-01 ENCOUNTER — Other Ambulatory Visit: Payer: Self-pay

## 2021-03-01 ENCOUNTER — Ambulatory Visit
Admission: RE | Admit: 2021-03-01 | Discharge: 2021-03-01 | Disposition: A | Discharge status: Home / Self Care | Payer: Medicare Other | Source: Ambulatory Visit | Attending: Internal Medicine | Admitting: Internal Medicine

## 2021-03-01 DIAGNOSIS — Z1231 Encounter for screening mammogram for malignant neoplasm of breast: Secondary | ICD-10-CM | POA: Insufficient documentation

## 2021-03-08 ENCOUNTER — Other Ambulatory Visit: Payer: Self-pay | Admitting: Internal Medicine

## 2021-03-08 DIAGNOSIS — R928 Other abnormal and inconclusive findings on diagnostic imaging of breast: Secondary | ICD-10-CM

## 2021-03-08 DIAGNOSIS — N6320 Unspecified lump in the left breast, unspecified quadrant: Secondary | ICD-10-CM

## 2021-03-10 ENCOUNTER — Ambulatory Visit
Admission: RE | Admit: 2021-03-10 | Discharge: 2021-03-10 | Disposition: A | Discharge status: Home / Self Care | Payer: Medicare Other | Source: Ambulatory Visit | Attending: Internal Medicine | Admitting: Internal Medicine

## 2021-03-10 ENCOUNTER — Other Ambulatory Visit: Payer: Self-pay

## 2021-03-10 DIAGNOSIS — N6320 Unspecified lump in the left breast, unspecified quadrant: Secondary | ICD-10-CM | POA: Insufficient documentation

## 2021-03-10 DIAGNOSIS — R928 Other abnormal and inconclusive findings on diagnostic imaging of breast: Secondary | ICD-10-CM | POA: Insufficient documentation

## 2021-04-07 ENCOUNTER — Other Ambulatory Visit: Payer: Self-pay | Admitting: Internal Medicine

## 2021-04-07 DIAGNOSIS — M7989 Other specified soft tissue disorders: Secondary | ICD-10-CM

## 2021-04-20 ENCOUNTER — Other Ambulatory Visit: Payer: Self-pay

## 2021-04-20 ENCOUNTER — Ambulatory Visit
Admission: RE | Admit: 2021-04-20 | Discharge: 2021-04-20 | Disposition: A | Discharge status: Home / Self Care | Payer: Medicare Other | Source: Ambulatory Visit | Attending: Internal Medicine | Admitting: Internal Medicine

## 2021-04-20 DIAGNOSIS — M7989 Other specified soft tissue disorders: Secondary | ICD-10-CM | POA: Diagnosis present

## 2021-06-12 ENCOUNTER — Other Ambulatory Visit: Payer: Self-pay | Admitting: Physician Assistant

## 2021-06-12 ENCOUNTER — Other Ambulatory Visit (HOSPITAL_COMMUNITY): Payer: Self-pay | Admitting: Physician Assistant

## 2021-06-12 ENCOUNTER — Ambulatory Visit
Admission: RE | Admit: 2021-06-12 | Discharge: 2021-06-12 | Disposition: A | Discharge status: Home / Self Care | Payer: Medicare Other | Source: Ambulatory Visit | Attending: Physician Assistant | Admitting: Physician Assistant

## 2021-06-12 DIAGNOSIS — M7989 Other specified soft tissue disorders: Secondary | ICD-10-CM | POA: Diagnosis present

## 2021-08-10 DIAGNOSIS — Z9889 Other specified postprocedural states: Secondary | ICD-10-CM | POA: Diagnosis not present

## 2021-08-10 DIAGNOSIS — G35 Multiple sclerosis: Secondary | ICD-10-CM | POA: Diagnosis not present

## 2021-08-10 DIAGNOSIS — M6289 Other specified disorders of muscle: Secondary | ICD-10-CM | POA: Diagnosis not present

## 2021-08-10 DIAGNOSIS — Z8603 Personal history of neoplasm of uncertain behavior: Secondary | ICD-10-CM | POA: Diagnosis not present

## 2021-08-22 ENCOUNTER — Other Ambulatory Visit: Payer: Self-pay

## 2021-08-22 ENCOUNTER — Ambulatory Visit (INDEPENDENT_AMBULATORY_CARE_PROVIDER_SITE_OTHER): Payer: Medicare HMO | Admitting: Vascular Surgery

## 2021-08-22 VITALS — BP 144/67 | HR 56 | Ht 65.0 in | Wt 196.0 lb

## 2021-08-22 DIAGNOSIS — I10 Essential (primary) hypertension: Secondary | ICD-10-CM | POA: Diagnosis not present

## 2021-08-22 DIAGNOSIS — I83811 Varicose veins of right lower extremities with pain: Secondary | ICD-10-CM

## 2021-08-22 DIAGNOSIS — E785 Hyperlipidemia, unspecified: Secondary | ICD-10-CM | POA: Diagnosis not present

## 2021-08-22 DIAGNOSIS — G35 Multiple sclerosis: Secondary | ICD-10-CM | POA: Insufficient documentation

## 2021-08-22 DIAGNOSIS — J45909 Unspecified asthma, uncomplicated: Secondary | ICD-10-CM | POA: Insufficient documentation

## 2021-08-22 DIAGNOSIS — M1990 Unspecified osteoarthritis, unspecified site: Secondary | ICD-10-CM | POA: Insufficient documentation

## 2021-08-22 DIAGNOSIS — E118 Type 2 diabetes mellitus with unspecified complications: Secondary | ICD-10-CM | POA: Diagnosis not present

## 2021-08-22 NOTE — Assessment & Plan Note (Signed)
blood glucose control important in reducing the progression of atherosclerotic disease. Also, involved in wound healing. On appropriate medications.  

## 2021-08-22 NOTE — Assessment & Plan Note (Signed)
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  We discussed it is certainly reasonable to consider ablation of the right great saphenous vein and foam sclerotherapy for the large varicosities to prevent recurrent thrombophlebitis, but she really does not want to have anything done and is currently asymptomatic.  She is going to wear compression socks and elevate her legs.  She is going to return in 3 months in follow-up.

## 2021-08-22 NOTE — Assessment & Plan Note (Signed)
blood pressure control important in reducing the progression of atherosclerotic disease. On appropriate oral medications.  

## 2021-08-22 NOTE — Assessment & Plan Note (Signed)
lipid control important in reducing the progression of atherosclerotic disease. Continue statin therapy  

## 2021-08-22 NOTE — Progress Notes (Signed)
Patient ID: Caroline Hall, female   DOB: [birth year], 76 y.o.   MRN: [id number]  Chief Complaint  Patient presents with   New Patient (Initial Visit)    NP consult    HPI Caroline Hall is a 76 y.o. female.  I am asked to see the patient by Dr. Doy Hutching for evaluation of venous insufficiency and relatively recent superficial thrombophlebitis of the right leg.  The patient reports having had phlebitis many years ago after pregnancy.  She has had prominent varicosities in that right leg pretty much since that time that have gotten bigger over time.  She does have some chronic swelling in that leg but it really does not hurt her much.  No ulceration or infection.  A couple months ago, she developed very tender red hard areas overlying the veins on the medial right calf and knee area.  These took a couple of weeks to improve but ultimately did improve.  She was started on Eliquis but was not on it long secondary to GI bleeding.  Her symptoms still improved over a couple of weeks even without anticoagulation.     Past Medical History:  Diagnosis Date   Arthritis    left knee   Asthma    Cancer ([place]) 2005   skin, face   Diabetes mellitus without complication (HCC)    GERD (gastroesophageal reflux disease)    Hyperlipidemia    Multiple sclerosis (HCC)    PONV (postoperative nausea and vomiting)    Sleep apnea    CPAP   Vertigo    last episode 03/2017   Wears dentures    partial upper and lower    Past Surgical History:  Procedure Laterality Date   ABDOMINAL HYSTERECTOMY     BREAST CYST ASPIRATION Right 01/01/2014   negative   BREAST EXCISIONAL BIOPSY Bilateral 1970   surgical biopsy, negative, approximate dates   BROW LIFT Bilateral 03/25/2018   Procedure: BLEPHAROPLASTY UPPER EYELID WITH EXCESS SKIN DIABETIC;  Surgeon: Karle Starch, MD;  Location: [place];  Service: Ophthalmology;  Laterality: Bilateral;  Diabetic - oral meds sleep apnea   COLONOSCOPY  [id number]    COLONOSCOPY WITH PROPOFOL N/A 01/17/2015   Procedure: COLONOSCOPY WITH PROPOFOL;  Surgeon: Manya Silvas, MD;  [facility] ENDOSCOPY;  Service: Endoscopy;  Laterality: N/A;   ESOPHAGOGASTRODUODENOSCOPY     EXCISION PARTIAL PHALANX Right 01/29/2018   Procedure: EXCISION PARTIAL PHALANX-4TH TOE(HEMIPHALANGECTOMY);  Surgeon: Samara Deist, DPM;  Location: [place];  Service: Podiatry;  Laterality: Right;  Diabetic - oral meds   HAMMER TOE SURGERY Right 01/29/2018   Procedure: HAMMER TOE CORRECTION-5TH TOE;  Surgeon: Samara Deist, DPM;  Location: [place];  Service: Podiatry;  Laterality: Right;   MENINGIOMA REMOVAL       Family History  Problem Relation Age of Onset   Breast cancer Maternal Grandmother 87  No bleeding or clotting disorders.  No autoimmune diseases.   Social History   Tobacco Use   Smoking status: Never   Smokeless tobacco: Never  Vaping Use   Vaping Use: Never used  Substance Use Topics   Alcohol use: No   Drug use: No     Allergies  Allergen Reactions   Erythromycin     Other reaction(s): Unknown   Naproxen Other (See Comments)    Stomach upset   Omeprazole Diarrhea   Other Other (See Comments)    EES - stomach upset    Current Outpatient Medications  Medication Sig Dispense Refill   amLODipine (NORVASC) 5 MG tablet Take 5 mg by mouth daily.     aspirin EC 81 MG tablet Take 81 mg by mouth daily.     atorvastatin (LIPITOR) 10 MG tablet Take 10 mg by mouth daily.     BIOTIN PO Take by mouth daily.     bisoprolol-hydrochlorothiazide (ZIAC) 5-6.25 MG tablet Take 1 tablet by mouth daily.     Cyanocobalamin 1500 MCG TBDP Take 1 tablet by mouth daily.     erythromycin ophthalmic ointment Apply to sutures 4 times a day for 10-12 days.  Discontinue if allergy develops and call our office 3.5 g 2   famotidine (PEPCID) 10 MG tablet Take 10 mg by mouth daily as needed for heartburn or indigestion.     folic acid (FOLVITE) 846 MCG  tablet Take 800 mcg by mouth daily.     gabapentin (NEURONTIN) 300 MG capsule Take 300 mg by mouth at bedtime.     glimepiride (AMARYL) 4 MG tablet Take 4 mg by mouth daily with breakfast.     losartan (COZAAR) 100 MG tablet Take by mouth.     montelukast (SINGULAIR) 10 MG tablet Take 10 mg by mouth at bedtime.     NON FORMULARY Take 10 mg by mouth 2 (two) times daily. 4 aminopyridine     oxybutynin (DITROPAN-XL) 10 MG 24 hr tablet Take 10 mg by mouth daily.     phenytoin (DILANTIN) 100 MG ER capsule Take 300 mg by mouth at bedtime.     pioglitazone (ACTOS) 30 MG tablet Take 1 tablet by mouth daily.     predniSONE (DELTASONE) 10 MG tablet Take by mouth.     No current facility-administered medications for this visit.      REVIEW OF SYSTEMS (Negative unless checked)  Constitutional: [] Weight loss  [] Fever  [] Chills Cardiac: [] Chest pain   [] Chest pressure   [] Palpitations   [] Shortness of breath when laying flat   [] Shortness of breath at rest   [] Shortness of breath with exertion. Vascular:  [] Pain in legs with walking   [] Pain in legs at rest   [] Pain in legs when laying flat   [] Claudication   [] Pain in feet when walking  [] Pain in feet at rest  [] Pain in feet when laying flat   [x] History of DVT   [x] Phlebitis   [x] Swelling in legs   [] Varicose veins   [] Non-healing ulcers Pulmonary:   [] Uses home oxygen   [] Productive cough   [] Hemoptysis   [] Wheeze  [] COPD   [] Asthma Neurologic:  [] Dizziness  [] Blackouts   [] Seizures   [] History of stroke   [] History of TIA  [] Aphasia   [] Temporary blindness   [] Dysphagia   [] Weakness or numbness in arms   [] Weakness or numbness in legs Musculoskeletal:  [x] Arthritis   [] Joint swelling   [] Joint pain   [] Low back pain Hematologic:  [] Easy bruising  [] Easy bleeding   [] Hypercoagulable state   [] Anemic  [] Hepatitis Gastrointestinal:  [] Blood in stool   [] Vomiting blood  [x] Gastroesophageal reflux/heartburn   [] Abdominal pain Genitourinary:  [] Chronic  kidney disease   [] Difficult urination  [] Frequent urination  [] Burning with urination   [] Hematuria Skin:  [] Rashes   [] Ulcers   [] Wounds Psychological:  [] History of anxiety   []  History of major depression.    Physical Exam BP (!) 144/67    Pulse (!) 56    Ht 5\' 5"  (1.651 m)    Wt 196 lb (88.9 kg)  BMI 32.62 kg/m  Gen:  WD/WN, NAD Head: [place]/AT, No temporalis wasting.  Ear/Nose/Throat: Hearing grossly intact, nares w/o erythema or drainage, oropharynx w/o Erythema/Exudate Eyes: Conjunctiva clear, sclera non-icteric  Neck: trachea midline.  No JVD.  Pulmonary:  Good air movement, respirations not labored, no use of accessory muscles  Cardiac: RRR, no JVD Vascular:  Vessel Right Left  Radial Palpable Palpable                                   Gastrointestinal:. No masses, surgical incisions, or scars. Musculoskeletal: M/S 5/5 throughout.  Extremities without ischemic changes.  No deformity or atrophy. Prominent varicosities in the right leg measuring 3-4 mm in diameter, 1-2+ RLE edema. Neurologic: Sensation grossly intact in extremities.  Symmetrical.  Speech is fluent. Motor exam as listed above. Psychiatric: Judgment intact, Mood & affect appropriate for pt's clinical situation. Dermatologic: No rashes or ulcers noted.  No cellulitis or open wounds.    Radiology No results found.  Labs No results found for this or any previous visit (from the past 2160 hour(s)).  Assessment/Plan:  Varicose veins of leg with pain, right The patient has prominent varicosities of the right leg with swelling and thrombophlebitis.  We discussed it is certainly reasonable to consider ablation of the right great saphenous vein and foam sclerotherapy for the large varicosities to prevent recurrent thrombophlebitis, but she really does not want to have anything done and is currently asymptomatic.  She is going to wear compression socks and elevate her legs.  She is going to return in 3 months  in follow-up.  HTN, goal below 140/80 blood pressure control important in reducing the progression of atherosclerotic disease. On appropriate oral medications.   Type II diabetes mellitus with complication (HCC) blood glucose control important in reducing the progression of atherosclerotic disease. Also, involved in wound healing. On appropriate medications.   Hyperlipidemia lipid control important in reducing the progression of atherosclerotic disease. Continue statin therapy      Leotis Pain 08/22/2021, 4:04 PM   This note was created with Dragon medical transcription system.  Any errors from dictation are unintentional.

## 2021-09-14 IMAGING — MG DIGITAL SCREENING BILAT W/ TOMO W/ CAD
8 series · 8 of 24 positions shown · non-contrast
Comparison: Previous exam(s).

CLINICAL DATA: Screening.

EXAM:
DIGITAL SCREENING BILATERAL MAMMOGRAM WITH TOMO AND CAD

[R MLO synth-2D]
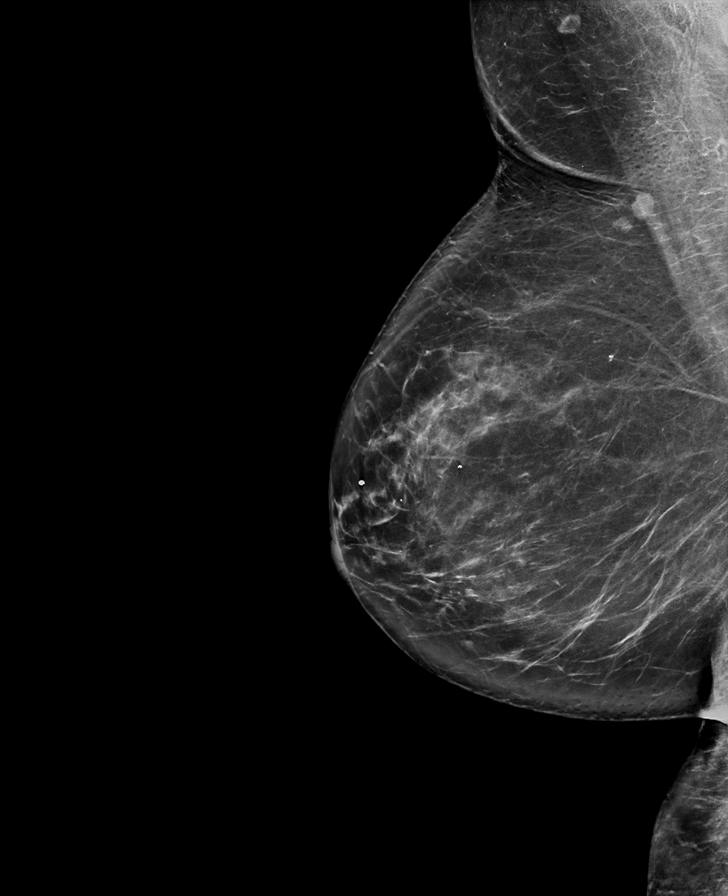

[L CC synth-2D]
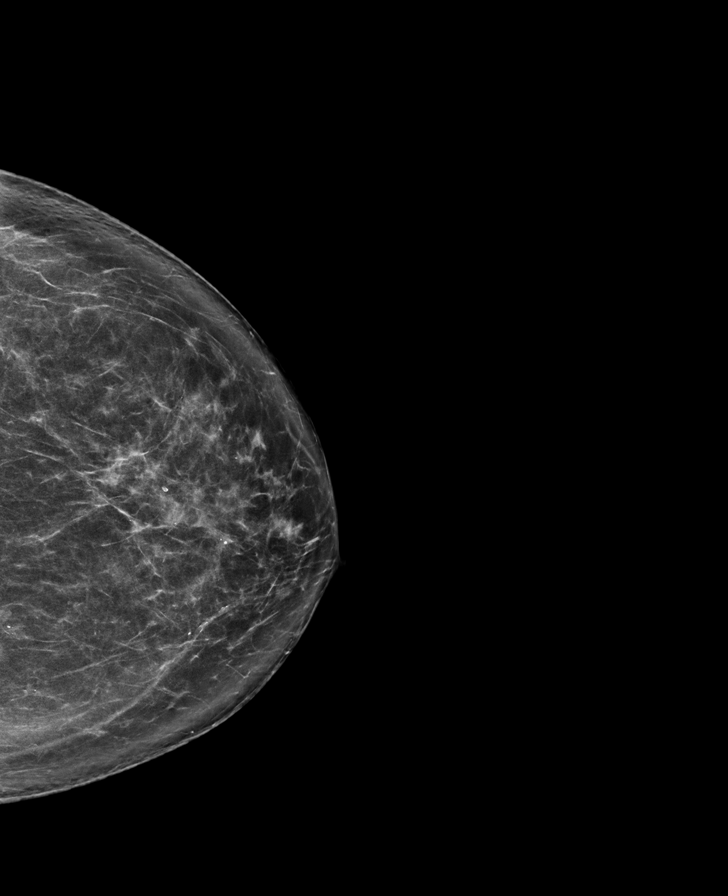

[R CC synth-2D]
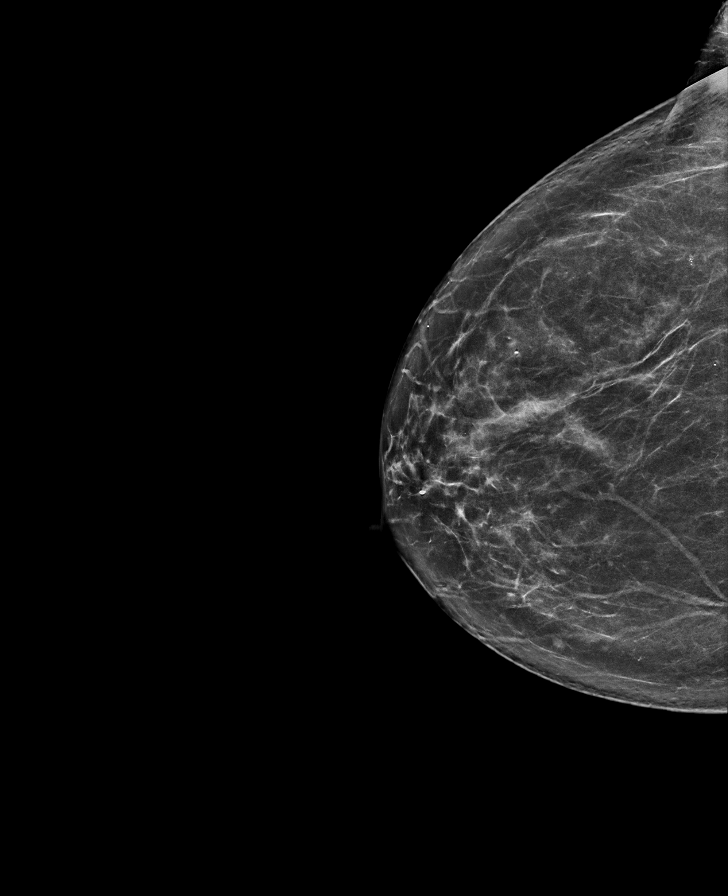

[L MLO synth-2D]
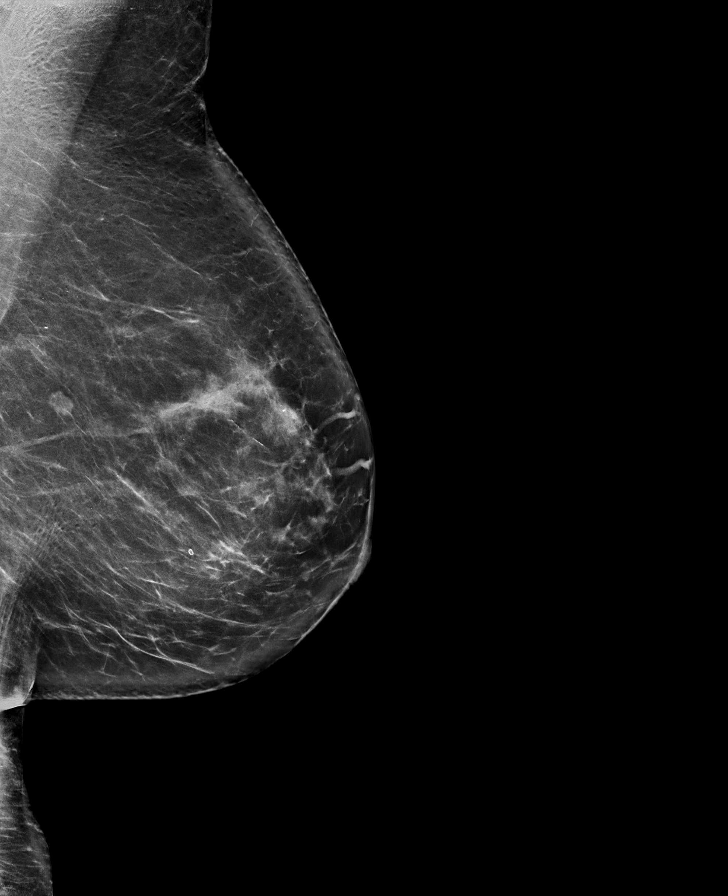

[L MLO tomo · tomo slice 46/91.0]
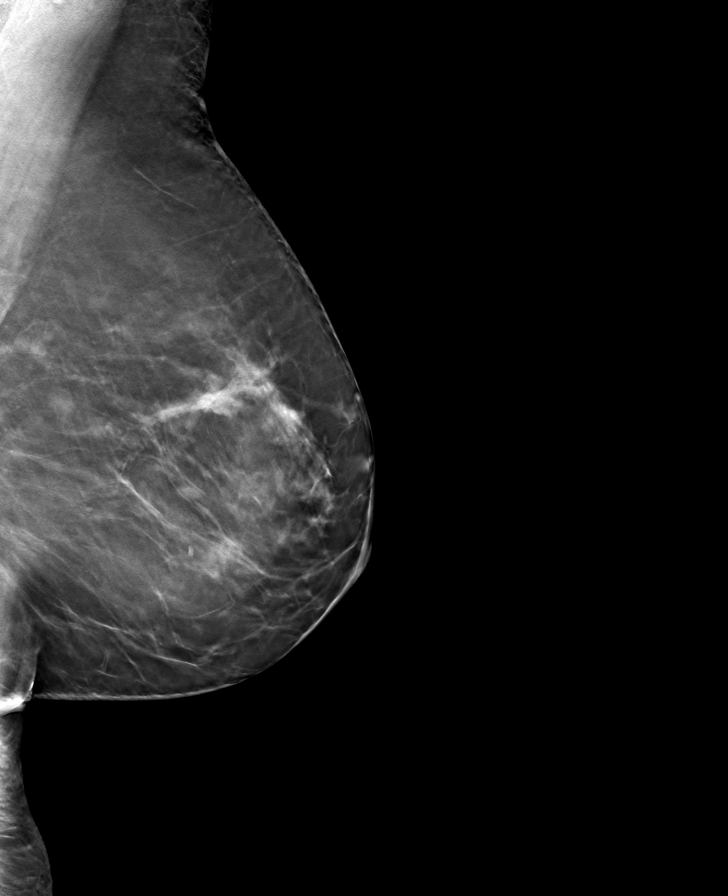

[L CC tomo · tomo slice 41/80.0]
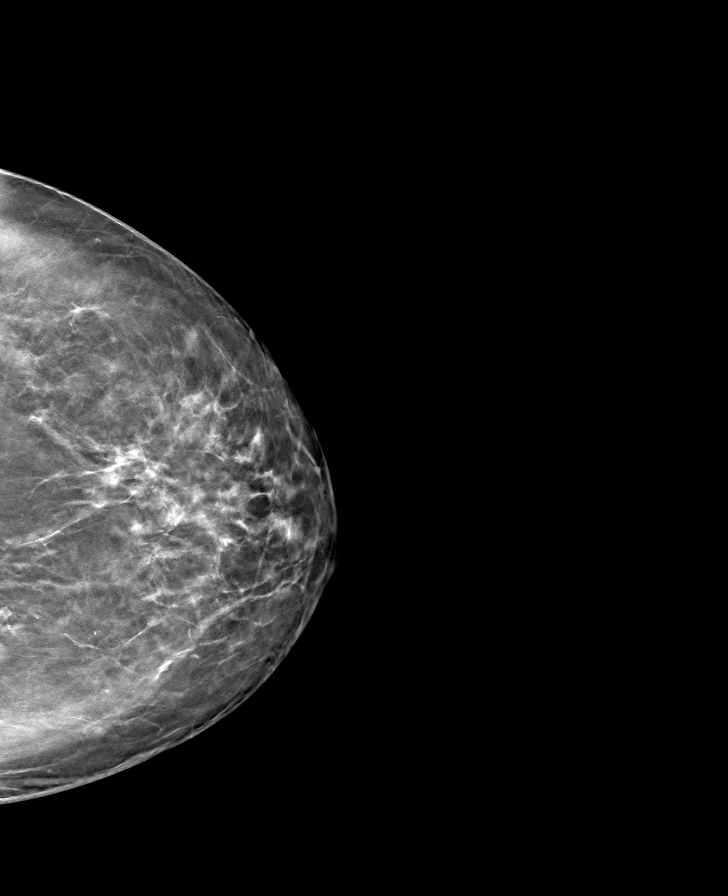

[R MLO tomo · tomo slice 44/87.0]
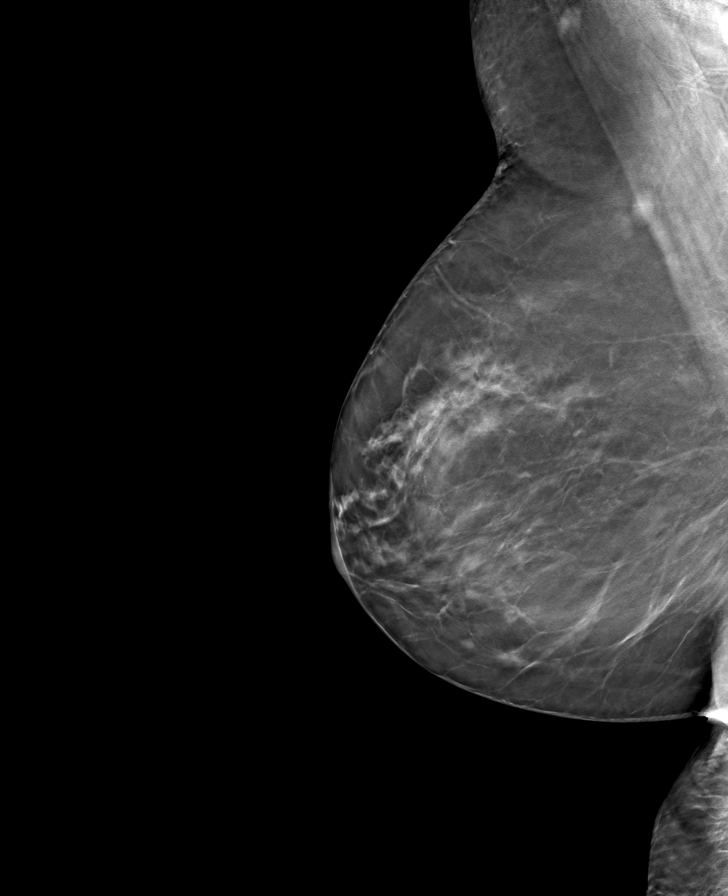

[R CC tomo · tomo slice 39/78.0]
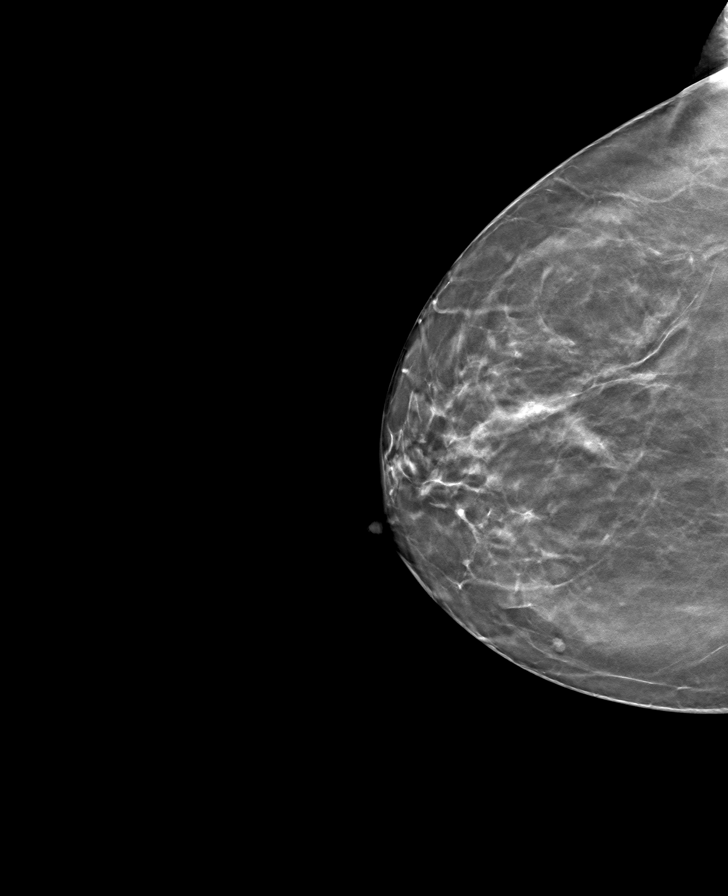

[8 of 24 positions shown; findings below may reference images not displayed]

ACR Breast Density Category b: There are scattered areas of
fibroglandular density.
FINDINGS: There are no findings suspicious for malignancy. Images were
processed with CAD.
IMPRESSION: No mammographic evidence of malignancy. A result letter of this
screening mammogram will be mailed directly to the patient.

RECOMMENDATION:
Screening mammogram in one year. (Code:[id number])

BI-RADS CATEGORY  1: Negative.

## 2021-10-11 DIAGNOSIS — Z1211 Encounter for screening for malignant neoplasm of colon: Secondary | ICD-10-CM | POA: Diagnosis not present

## 2021-10-11 DIAGNOSIS — Z79899 Other long term (current) drug therapy: Secondary | ICD-10-CM | POA: Diagnosis not present

## 2021-10-11 DIAGNOSIS — G35 Multiple sclerosis: Secondary | ICD-10-CM | POA: Diagnosis not present

## 2021-10-11 DIAGNOSIS — E118 Type 2 diabetes mellitus with unspecified complications: Secondary | ICD-10-CM | POA: Diagnosis not present

## 2021-10-11 DIAGNOSIS — I10 Essential (primary) hypertension: Secondary | ICD-10-CM | POA: Diagnosis not present

## 2021-10-11 DIAGNOSIS — G4733 Obstructive sleep apnea (adult) (pediatric): Secondary | ICD-10-CM | POA: Diagnosis not present

## 2021-10-11 DIAGNOSIS — E782 Mixed hyperlipidemia: Secondary | ICD-10-CM | POA: Diagnosis not present

## 2021-11-24 ENCOUNTER — Other Ambulatory Visit (INDEPENDENT_AMBULATORY_CARE_PROVIDER_SITE_OTHER): Payer: Self-pay | Admitting: Vascular Surgery

## 2021-11-24 DIAGNOSIS — I83811 Varicose veins of right lower extremities with pain: Secondary | ICD-10-CM

## 2021-11-27 ENCOUNTER — Ambulatory Visit (INDEPENDENT_AMBULATORY_CARE_PROVIDER_SITE_OTHER): Payer: Medicare HMO | Admitting: Nurse Practitioner

## 2021-11-27 ENCOUNTER — Encounter (INDEPENDENT_AMBULATORY_CARE_PROVIDER_SITE_OTHER): Payer: Self-pay | Admitting: Nurse Practitioner

## 2021-11-27 ENCOUNTER — Ambulatory Visit (INDEPENDENT_AMBULATORY_CARE_PROVIDER_SITE_OTHER): Payer: Medicare HMO

## 2021-11-27 VITALS — BP 140/77 | HR 85 | Resp 16 | Wt 187.6 lb

## 2021-11-27 DIAGNOSIS — I83811 Varicose veins of right lower extremities with pain: Secondary | ICD-10-CM

## 2021-11-27 DIAGNOSIS — E785 Hyperlipidemia, unspecified: Secondary | ICD-10-CM

## 2021-11-27 DIAGNOSIS — E118 Type 2 diabetes mellitus with unspecified complications: Secondary | ICD-10-CM | POA: Diagnosis not present

## 2021-12-09 ENCOUNTER — Encounter (INDEPENDENT_AMBULATORY_CARE_PROVIDER_SITE_OTHER): Payer: Self-pay | Admitting: Nurse Practitioner

## 2021-12-09 NOTE — Progress Notes (Signed)
Subjective:    Patient ID: Caroline Hall, female    DOB: July 15, 1946, 76 y.o.   MRN: [id number] Chief Complaint  Patient presents with   Follow-up    Ultrasound follow up    Caroline Hall is a 76 y.o. female.  The patient returns today for noninvasive studies for follow-up evaluation after recent superficial thrombophlebitis in the right leg.   The patient reports having had phlebitis many years ago after pregnancy.  She has had prominent varicosities in that right leg pretty much since that time that have gotten bigger over time.  She does have some chronic swelling in that leg but it really does not hurt her much.  No ulceration or infection.  A couple months ago, she developed very tender red hard areas overlying the veins on the medial right calf and knee area.  These took a couple of weeks to improve but ultimately did improve.  She was started on Eliquis but was not on it long secondary to GI bleeding.  Her symptoms still improved over a couple of weeks even without anticoagulation.  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  She denies any previous vascular interventions such as endovenous ablation or sclerotherapy.  Today noninvasive studies show evidence of deep venous insufficiency in the common femoral vein.  The patient does have reflux in the right great saphenous vein at the saphenofemoral junction as well as in the proximal bilateral great saphenous vein.  There is a noted minimally occlusive chronic thrombus in the medial knee level varicosity.  It is also noted that there are normal Doppler waveforms noted in the distal tibial arteries of the right lower extremity.   Review of Systems  Cardiovascular:  Positive for leg swelling.  All other systems reviewed and are negative.     Objective:   Physical Exam Vitals reviewed.  HENT:     Head: Normocephalic.  Cardiovascular:     Rate and Rhythm: Normal rate.     Pulses: Normal pulses.   Pulmonary:     Effort: Pulmonary effort is normal.  Skin:    General: Skin is warm and dry.  Neurological:     Mental Status: She is alert and oriented to person, place, and time.  Psychiatric:        Mood and Affect: Mood normal.        Behavior: Behavior normal.        Thought Content: Thought content normal.        Judgment: Judgment normal.    BP 140/77 (BP Location: Right Arm)   Pulse 85   Resp 16   Wt 187 lb 9.6 oz (85.1 kg)   BMI 31.22 kg/m   Past Medical History:  Diagnosis Date   Arthritis    left knee   Asthma    Cancer ([place]) 2005   skin, face   Diabetes mellitus without complication (HCC)    GERD (gastroesophageal reflux disease)    Hyperlipidemia    Multiple sclerosis (HCC)    PONV (postoperative nausea and vomiting)    Sleep apnea    CPAP   Vertigo    last episode 03/2017   Wears dentures    partial upper and lower    Social History   Socioeconomic History   Marital status: Married    Spouse name: Not on file   Number of children: Not on file   Years of education: Not on file   Highest education level:  Not on file  Occupational History   Not on file  Tobacco Use   Smoking status: Never   Smokeless tobacco: Never  Vaping Use   Vaping Use: Never used  Substance and Sexual Activity   Alcohol use: No   Drug use: No   Sexual activity: Not on file  Other Topics Concern   Not on file  Social History Narrative   Not on file   Social Determinants of Health   Financial Resource Strain: Not on file  Food Insecurity: Not on file  Transportation Needs: Not on file  Physical Activity: Not on file  Stress: Not on file  Social Connections: Not on file  Intimate Partner Violence: Not on file    Past Surgical History:  Procedure Laterality Date   ABDOMINAL HYSTERECTOMY     BREAST CYST ASPIRATION Right 01/01/2014   negative   BREAST EXCISIONAL BIOPSY Bilateral 1970   surgical biopsy, negative, approximate dates   BROW LIFT Bilateral 03/25/2018    Procedure: BLEPHAROPLASTY UPPER EYELID WITH EXCESS SKIN DIABETIC;  Surgeon: Karle Starch, MD;  Location: [place];  Service: Ophthalmology;  Laterality: Bilateral;  Diabetic - oral meds sleep apnea   COLONOSCOPY  [id number]   COLONOSCOPY WITH PROPOFOL N/A 01/17/2015   Procedure: COLONOSCOPY WITH PROPOFOL;  Surgeon: Manya Silvas, MD;  Location[facility] ENDOSCOPY;  Service: Endoscopy;  Laterality: N/A;   ESOPHAGOGASTRODUODENOSCOPY     EXCISION PARTIAL PHALANX Right 01/29/2018   Procedure: EXCISION PARTIAL PHALANX-4TH TOE(HEMIPHALANGECTOMY);  Surgeon: Samara Deist, DPM;  Location: [place];  Service: Podiatry;  Laterality: Right;  Diabetic - oral meds   HAMMER TOE SURGERY Right 01/29/2018   Procedure: HAMMER TOE CORRECTION-5TH TOE;  Surgeon: Samara Deist, DPM;  Location: [place];  Service: Podiatry;  Laterality: Right;   MENINGIOMA REMOVAL      Family History  Problem Relation Age of Onset   Breast cancer Maternal Grandmother 60    Allergies  Allergen Reactions   Erythromycin     Other reaction(s): Unknown   Naproxen Other (See Comments)    Stomach upset   Omeprazole Diarrhea   Other Other (See Comments)    EES - stomach upset       Latest Ref Rng & Units 08/15/2013    3:40 PM  CBC  WBC 3.6 - 11.0 x10 3/mm 3 9.3    Hemoglobin 12.0 - 16.0 g/dL 14.6    Hematocrit 35.0 - 47.0 % 42.5    Platelets 150 - 440 x10 3/mm 3 137        CMP     Component Value Date/Time   NA 138 08/15/2013 1540   K 2.9 (L) 08/15/2013 1540   CL 104 08/15/2013 1540   CO2 29 08/15/2013 1540   GLUCOSE 156 (H) 08/15/2013 1540   BUN 10 08/15/2013 1540   CREATININE 0.90 09/04/2018 0907   CREATININE 0.65 08/15/2013 1540   CALCIUM 8.7 08/15/2013 1540   GFRNONAA >60 08/15/2013 1540   GFRAA >60 08/15/2013 1540     No results found.     Assessment & Plan:   1. Varicose veins of leg with pain, right Recommend  I have reviewed my previous  discussion with the  patient regarding  varicose veins and why they cause symptoms. Patient will continue  wearing graduated compression stockings class 1 on a daily basis, beginning first thing in the morning and removing them in the evening.    In addition, behavioral modification including elevation during the  day was again discussed and this will continue.  The patient has utilized over the counter pain medications and has been exercising.  However, at this time conservative therapy has not alleviated the patient's symptoms of leg pain and swelling  Recommend: laser ablation of the right great saphenous veins to eliminate the symptoms of pain and swelling of the lower extremities caused by the severe superficial venous reflux disease.   - VAS [place] LOWER EXTREMITY VENOUS REFLUX  2. Hyperlipidemia, unspecified hyperlipidemia type Continue statin as ordered and reviewed, no changes at this time   3. Type II diabetes mellitus with complication (HCC) Continue hypoglycemic medications as already ordered, these medications have been reviewed and there are no changes at this time.  Hgb A1C to be monitored as already arranged by primary service    Current Outpatient Medications on File Prior to Visit  Medication Sig Dispense Refill   amLODipine (NORVASC) 5 MG tablet Take 5 mg by mouth daily.     aspirin EC 81 MG tablet Take 81 mg by mouth daily.     atorvastatin (LIPITOR) 10 MG tablet Take 10 mg by mouth daily.     BIOTIN PO Take by mouth daily.     bisoprolol-hydrochlorothiazide (ZIAC) 5-6.25 MG tablet Take 1 tablet by mouth daily.     cetirizine (ZYRTEC) 10 MG tablet Take 10 mg by mouth daily.     Cholecalciferol (D3-1000) 25 MCG (1000 UT) capsule Take 1,000 Units by mouth daily.     Cyanocobalamin 1500 MCG TBDP Take 1 tablet by mouth daily.     famotidine (PEPCID) 10 MG tablet Take 10 mg by mouth daily as needed for heartburn or indigestion.     gabapentin (NEURONTIN) 300 MG capsule Take 300 mg by mouth at  bedtime.     glimepiride (AMARYL) 4 MG tablet Take 4 mg by mouth daily with breakfast.     losartan (COZAAR) 100 MG tablet Take by mouth.     montelukast (SINGULAIR) 10 MG tablet Take 10 mg by mouth at bedtime.     NON FORMULARY Take 10 mg by mouth 2 (two) times daily. 4 aminopyridine     oxybutynin (DITROPAN-XL) 10 MG 24 hr tablet Take 10 mg by mouth daily.     pioglitazone (ACTOS) 30 MG tablet Take 1 tablet by mouth daily.     Turmeric (QC TUMERIC COMPLEX PO) Take by mouth.     erythromycin ophthalmic ointment Apply to sutures 4 times a day for 10-12 days.  Discontinue if allergy develops and call our office (Patient not taking: Reported on 03/24/99) 3.5 g 2   folic acid (FOLVITE) 712 MCG tablet Take 800 mcg by mouth daily. (Patient not taking: Reported on 11/27/2021)     phenytoin (DILANTIN) 100 MG ER capsule Take 300 mg by mouth at bedtime. (Patient not taking: Reported on 11/27/2021)     predniSONE (DELTASONE) 10 MG tablet Take by mouth. (Patient not taking: Reported on 11/27/2021)     No current facility-administered medications on file prior to visit.    There are no Patient Instructions on file for this visit. No follow-ups on file.   Kris Hartmann, NP

## 2022-01-04 DIAGNOSIS — Z79899 Other long term (current) drug therapy: Secondary | ICD-10-CM | POA: Diagnosis not present

## 2022-01-04 DIAGNOSIS — E782 Mixed hyperlipidemia: Secondary | ICD-10-CM | POA: Diagnosis not present

## 2022-01-04 DIAGNOSIS — E118 Type 2 diabetes mellitus with unspecified complications: Secondary | ICD-10-CM | POA: Diagnosis not present

## 2022-01-04 DIAGNOSIS — I10 Essential (primary) hypertension: Secondary | ICD-10-CM | POA: Diagnosis not present

## 2022-01-15 ENCOUNTER — Other Ambulatory Visit (INDEPENDENT_AMBULATORY_CARE_PROVIDER_SITE_OTHER): Payer: Self-pay | Admitting: Nurse Practitioner

## 2022-01-18 DIAGNOSIS — D2271 Melanocytic nevi of right lower limb, including hip: Secondary | ICD-10-CM | POA: Diagnosis not present

## 2022-01-18 DIAGNOSIS — D2261 Melanocytic nevi of right upper limb, including shoulder: Secondary | ICD-10-CM | POA: Diagnosis not present

## 2022-01-18 DIAGNOSIS — Z08 Encounter for follow-up examination after completed treatment for malignant neoplasm: Secondary | ICD-10-CM | POA: Diagnosis not present

## 2022-01-18 DIAGNOSIS — D2262 Melanocytic nevi of left upper limb, including shoulder: Secondary | ICD-10-CM | POA: Diagnosis not present

## 2022-01-18 DIAGNOSIS — D2272 Melanocytic nevi of left lower limb, including hip: Secondary | ICD-10-CM | POA: Diagnosis not present

## 2022-01-18 DIAGNOSIS — D225 Melanocytic nevi of trunk: Secondary | ICD-10-CM | POA: Diagnosis not present

## 2022-01-18 DIAGNOSIS — L821 Other seborrheic keratosis: Secondary | ICD-10-CM | POA: Diagnosis not present

## 2022-01-18 DIAGNOSIS — Z85828 Personal history of other malignant neoplasm of skin: Secondary | ICD-10-CM | POA: Diagnosis not present

## 2022-01-18 DIAGNOSIS — B354 Tinea corporis: Secondary | ICD-10-CM | POA: Diagnosis not present

## 2022-02-13 ENCOUNTER — Other Ambulatory Visit: Payer: Self-pay | Admitting: Internal Medicine

## 2022-02-13 DIAGNOSIS — Z1231 Encounter for screening mammogram for malignant neoplasm of breast: Secondary | ICD-10-CM

## 2022-03-01 DIAGNOSIS — L309 Dermatitis, unspecified: Secondary | ICD-10-CM | POA: Diagnosis not present

## 2022-03-01 DIAGNOSIS — L308 Other specified dermatitis: Secondary | ICD-10-CM | POA: Diagnosis not present

## 2022-03-09 ENCOUNTER — Ambulatory Visit
Admission: RE | Admit: 2022-03-09 | Discharge: 2022-03-09 | Disposition: A | Discharge status: Home / Self Care | Payer: Medicare HMO | Source: Ambulatory Visit | Attending: Internal Medicine | Admitting: Internal Medicine

## 2022-03-09 DIAGNOSIS — Z1231 Encounter for screening mammogram for malignant neoplasm of breast: Secondary | ICD-10-CM | POA: Diagnosis not present

## 2022-03-13 ENCOUNTER — Ambulatory Visit (INDEPENDENT_AMBULATORY_CARE_PROVIDER_SITE_OTHER): Payer: Medicare HMO | Admitting: Vascular Surgery

## 2022-03-13 VITALS — BP 136/77 | HR 57 | Resp 12 | Ht 64.0 in | Wt 180.0 lb

## 2022-03-13 DIAGNOSIS — I83811 Varicose veins of right lower extremities with pain: Secondary | ICD-10-CM | POA: Diagnosis not present

## 2022-03-13 NOTE — Progress Notes (Signed)
Caroline Hall is a 76 y.o. female who presents with symptomatic venous reflux  Past Medical History:  Diagnosis Date   Arthritis    left knee   Asthma    Cancer ([place]) 2005   skin, face   Diabetes mellitus without complication (HCC)    GERD (gastroesophageal reflux disease)    Hyperlipidemia    Multiple sclerosis (HCC)    PONV (postoperative nausea and vomiting)    Sleep apnea    CPAP   Vertigo    last episode 03/2017   Wears dentures    partial upper and lower    Past Surgical History:  Procedure Laterality Date   ABDOMINAL HYSTERECTOMY     BREAST CYST ASPIRATION Right 01/01/2014   negative   BREAST EXCISIONAL BIOPSY Bilateral 1970   surgical biopsy, negative, approximate dates   BROW LIFT Bilateral 03/25/2018   Procedure: BLEPHAROPLASTY UPPER EYELID WITH EXCESS SKIN DIABETIC;  Surgeon: Karle Starch, MD;  Location: [place];  Service: Ophthalmology;  Laterality: Bilateral;  Diabetic - oral meds sleep apnea   COLONOSCOPY  [id number]   COLONOSCOPY WITH PROPOFOL N/A 01/17/2015   Procedure: COLONOSCOPY WITH PROPOFOL;  Surgeon: Manya Silvas, MD;  Location: [facility] ENDOSCOPY;  Service: Endoscopy;  Laterality: N/A;   ESOPHAGOGASTRODUODENOSCOPY     EXCISION PARTIAL PHALANX Right 01/29/2018   Procedure: EXCISION PARTIAL PHALANX-4TH TOE(HEMIPHALANGECTOMY);  Surgeon: Samara Deist, DPM;  Location: [place];  Service: Podiatry;  Laterality: Right;  Diabetic - oral meds   HAMMER TOE SURGERY Right 01/29/2018   Procedure: HAMMER TOE CORRECTION-5TH TOE;  Surgeon: Samara Deist, DPM;  Location: [place];  Service: Podiatry;  Laterality: Right;   MENINGIOMA REMOVAL       Current Outpatient Medications:    amLODipine (NORVASC) 5 MG tablet, Take 5 mg by mouth daily., Disp: , Rfl:    aspirin EC 81 MG tablet, Take 81 mg by mouth daily., Disp: , Rfl:    atorvastatin (LIPITOR) 10 MG tablet, Take 10 mg by mouth daily., Disp: , Rfl:    BIOTIN PO, Take by mouth  daily., Disp: , Rfl:    bisoprolol-hydrochlorothiazide (ZIAC) 5-6.25 MG tablet, Take 1 tablet by mouth daily., Disp: , Rfl:    cetirizine (ZYRTEC) 10 MG tablet, Take 10 mg by mouth daily., Disp: , Rfl:    Cholecalciferol (D3-1000) 25 MCG (1000 UT) capsule, Take 1,000 Units by mouth daily., Disp: , Rfl:    Cyanocobalamin 1500 MCG TBDP, Take 1 tablet by mouth daily., Disp: , Rfl:    erythromycin ophthalmic ointment, Apply to sutures 4 times a day for 10-12 days.  Discontinue if allergy develops and call our office (Patient not taking: Reported on 11/27/2021), Disp: 3.5 g, Rfl: 2   famotidine (PEPCID) 10 MG tablet, Take 10 mg by mouth daily as needed for heartburn or indigestion., Disp: , Rfl:    folic acid (FOLVITE) 026 MCG tablet, Take 800 mcg by mouth daily. (Patient not taking: Reported on 11/27/2021), Disp: , Rfl:    gabapentin (NEURONTIN) 300 MG capsule, Take 300 mg by mouth at bedtime., Disp: , Rfl:    glimepiride (AMARYL) 4 MG tablet, Take 4 mg by mouth daily with breakfast., Disp: , Rfl:    losartan (COZAAR) 100 MG tablet, Take by mouth., Disp: , Rfl:    montelukast (SINGULAIR) 10 MG tablet, Take 10 mg by mouth at bedtime., Disp: , Rfl:    NON FORMULARY, Take 10 mg by mouth 2 (two) times daily. 4 aminopyridine,  Disp: , Rfl:    oxybutynin (DITROPAN-XL) 10 MG 24 hr tablet, Take 10 mg by mouth daily., Disp: , Rfl:    phenytoin (DILANTIN) 100 MG ER capsule, Take 300 mg by mouth at bedtime. (Patient not taking: Reported on 11/27/2021), Disp: , Rfl:    pioglitazone (ACTOS) 30 MG tablet, Take 1 tablet by mouth daily., Disp: , Rfl:    predniSONE (DELTASONE) 10 MG tablet, Take by mouth. (Patient not taking: Reported on 11/27/2021), Disp: , Rfl:    Turmeric (QC TUMERIC COMPLEX PO), Take by mouth., Disp: , Rfl:   Allergies  Allergen Reactions   Erythromycin     Other reaction(s): Unknown   Naproxen Other (See Comments)    Stomach upset   Omeprazole Diarrhea   Other Other (See Comments)    EES - stomach  upset     Varicose veins of leg with pain, right   PLAN: The patient's right lower extremity was sterilely prepped and draped. The ultrasound machine was used to visualize the saphenous vein throughout its course. A segment in the mid thigh at the junction from the large incompetent branch to the residual great saphenous vein was selected for access. The saphenous vein was accessed without difficulty using ultrasound guidance with a micropuncture needle. A 0.018 wire was then placed beyond the saphenofemoral junction and the needle was removed. The 65 cm sheath was then placed over the wire and the wire and dilator were removed. The laser fiber was then placed through the sheath and its tip was placed approximately 4 centimeters below the saphenofemoral junction. Tumescent anesthesia was then created with a dilute lidocaine solution. Laser energy was then delivered with constant withdrawal of the sheath and laser fiber. Approximately 589 joules of energy were delivered over a length of 11 centimeters using a 1470 Hz VenaCure machine at 7 W. Sterile dressings were placed. The patient tolerated the procedure well without obvious complications.   Follow-up in 1 week with post-laser duplex.

## 2022-03-20 ENCOUNTER — Other Ambulatory Visit (INDEPENDENT_AMBULATORY_CARE_PROVIDER_SITE_OTHER): Payer: Self-pay | Admitting: Vascular Surgery

## 2022-03-20 DIAGNOSIS — I83811 Varicose veins of right lower extremities with pain: Secondary | ICD-10-CM

## 2022-03-21 ENCOUNTER — Ambulatory Visit (INDEPENDENT_AMBULATORY_CARE_PROVIDER_SITE_OTHER): Payer: Medicare HMO

## 2022-03-21 DIAGNOSIS — I83811 Varicose veins of right lower extremities with pain: Secondary | ICD-10-CM

## 2022-04-06 ENCOUNTER — Encounter (INDEPENDENT_AMBULATORY_CARE_PROVIDER_SITE_OTHER): Payer: Self-pay | Admitting: Vascular Surgery

## 2022-04-06 ENCOUNTER — Ambulatory Visit (INDEPENDENT_AMBULATORY_CARE_PROVIDER_SITE_OTHER): Payer: Medicare HMO | Admitting: Vascular Surgery

## 2022-04-06 VITALS — BP 172/75 | HR 56 | Resp 16 | Wt 179.4 lb

## 2022-04-06 DIAGNOSIS — E118 Type 2 diabetes mellitus with unspecified complications: Secondary | ICD-10-CM | POA: Diagnosis not present

## 2022-04-06 DIAGNOSIS — I83811 Varicose veins of right lower extremities with pain: Secondary | ICD-10-CM

## 2022-04-06 DIAGNOSIS — I10 Essential (primary) hypertension: Secondary | ICD-10-CM

## 2022-04-06 NOTE — Assessment & Plan Note (Signed)
blood glucose control important in reducing the progression of atherosclerotic disease. Also, involved in wound healing. On appropriate medications.  

## 2022-04-06 NOTE — Assessment & Plan Note (Signed)
blood pressure control important in reducing the progression of atherosclerotic disease. On appropriate oral medications.  

## 2022-04-06 NOTE — Progress Notes (Signed)
MRN : [id number]  Caroline Hall is a 76 y.o. (August 23, 1945) female who presents with chief complaint of  Chief Complaint  Patient presents with   Follow-up    4 week post laser follow up  .  History of Present Illness: Patient returns today in follow up of her venous disease.  She is 4 weeks s/p R GSV EVLT for reflux.  She still has a red irritated area in the proximal to mid medial upper thigh near the top of her laser ablation site.  She does still have prominent varicosities which are causing pain and irritation.  She denies any fevers or chills.  No chest pain or shortness of breath.  Her postprocedural duplex showed a successful ablation without DVT.  Current Outpatient Medications  Medication Sig Dispense Refill   amLODipine (NORVASC) 5 MG tablet Take 5 mg by mouth daily.     aspirin EC 81 MG tablet Take 81 mg by mouth daily.     atorvastatin (LIPITOR) 10 MG tablet Take 10 mg by mouth daily.     BIOTIN PO Take by mouth daily.     bisoprolol-hydrochlorothiazide (ZIAC) 5-6.25 MG tablet Take 1 tablet by mouth daily.     cetirizine (ZYRTEC) 10 MG tablet Take 10 mg by mouth daily.     Cholecalciferol (D3-1000) 25 MCG (1000 UT) capsule Take 1,000 Units by mouth daily.     Cyanocobalamin 1500 MCG TBDP Take 1 tablet by mouth daily.     famotidine (PEPCID) 10 MG tablet Take 10 mg by mouth daily as needed for heartburn or indigestion.     gabapentin (NEURONTIN) 300 MG capsule Take 300 mg by mouth at bedtime.     glimepiride (AMARYL) 4 MG tablet Take 4 mg by mouth daily with breakfast.     losartan (COZAAR) 100 MG tablet Take by mouth.     montelukast (SINGULAIR) 10 MG tablet Take 10 mg by mouth at bedtime.     NON FORMULARY Take 10 mg by mouth 2 (two) times daily. 4 aminopyridine     oxybutynin (DITROPAN-XL) 10 MG 24 hr tablet Take 10 mg by mouth daily.     pioglitazone (ACTOS) 30 MG tablet Take 1 tablet by mouth daily.     Turmeric (QC TUMERIC COMPLEX PO) Take by mouth.      erythromycin ophthalmic ointment Apply to sutures 4 times a day for 10-12 days.  Discontinue if allergy develops and call our office (Patient not taking: Reported on 03/29/2835) 3.5 g 2   folic acid (FOLVITE) 629 MCG tablet Take 800 mcg by mouth daily. (Patient not taking: Reported on 11/27/2021)     phenytoin (DILANTIN) 100 MG ER capsule Take 300 mg by mouth at bedtime. (Patient not taking: Reported on 11/27/2021)     predniSONE (DELTASONE) 10 MG tablet Take by mouth. (Patient not taking: Reported on 11/27/2021)     No current facility-administered medications for this visit.    Past Medical History:  Diagnosis Date   Arthritis    left knee   Asthma    Cancer ([place]) 2005   skin, face   Diabetes mellitus without complication (HCC)    GERD (gastroesophageal reflux disease)    Hyperlipidemia    Multiple sclerosis (HCC)    PONV (postoperative nausea and vomiting)    Sleep apnea    CPAP   Vertigo    last episode 03/2017   Wears dentures    partial upper and lower    Past Surgical  History:  Procedure Laterality Date   ABDOMINAL HYSTERECTOMY     BREAST CYST ASPIRATION Right 01/01/2014   negative   BREAST EXCISIONAL BIOPSY Bilateral 1970   surgical biopsy, negative, approximate dates   BROW LIFT Bilateral 03/25/2018   Procedure: BLEPHAROPLASTY UPPER EYELID WITH EXCESS SKIN DIABETIC;  Surgeon: Karle Starch, MD;  Location: [place];  Service: Ophthalmology;  Laterality: Bilateral;  Diabetic - oral meds sleep apnea   COLONOSCOPY  [id number]   COLONOSCOPY WITH PROPOFOL N/A 01/17/2015   Procedure: COLONOSCOPY WITH PROPOFOL;  Surgeon: Manya Silvas, MD;  Location: [facility] ENDOSCOPY;  Service: Endoscopy;  Laterality: N/A;   ESOPHAGOGASTRODUODENOSCOPY     EXCISION PARTIAL PHALANX Right 01/29/2018   Procedure: EXCISION PARTIAL PHALANX-4TH TOE(HEMIPHALANGECTOMY);  Surgeon: Samara Deist, DPM;  Location: [place];  Service: Podiatry;  Laterality: Right;  Diabetic - oral meds   HAMMER  TOE SURGERY Right 01/29/2018   Procedure: HAMMER TOE CORRECTION-5TH TOE;  Surgeon: Samara Deist, DPM;  Location: [place];  Service: Podiatry;  Laterality: Right;   MENINGIOMA REMOVAL       Social History   Tobacco Use   Smoking status: Never   Smokeless tobacco: Never  Vaping Use   Vaping Use: Never used  Substance Use Topics   Alcohol use: No   Drug use: No      Family History  Problem Relation Age of Onset   Breast cancer Maternal Grandmother 62     Allergies  Allergen Reactions   Erythromycin     Other reaction(s): Unknown   Naproxen Other (See Comments)    Stomach upset   Omeprazole Diarrhea   Other Other (See Comments)    EES - stomach upset     REVIEW OF SYSTEMS (Negative unless checked)  Constitutional: '[]'$ Weight loss  '[]'$ Fever  '[]'$ Chills Cardiac: '[]'$ Chest pain   '[]'$ Chest pressure   '[]'$ Palpitations   '[]'$ Shortness of breath when laying flat   '[]'$ Shortness of breath at rest   '[]'$ Shortness of breath with exertion. Vascular:  '[]'$ Pain in legs with walking   '[]'$ Pain in legs at rest   '[]'$ Pain in legs when laying flat   '[]'$ Claudication   '[]'$ Pain in feet when walking  '[]'$ Pain in feet at rest  '[]'$ Pain in feet when laying flat   '[]'$ History of DVT   '[]'$ Phlebitis   '[x]'$ Swelling in legs   '[x]'$ Varicose veins   '[]'$ Non-healing ulcers Pulmonary:   '[]'$ Uses home oxygen   '[]'$ Productive cough   '[]'$ Hemoptysis   '[]'$ Wheeze  '[]'$ COPD   '[]'$ Asthma Neurologic:  '[]'$ Dizziness  '[]'$ Blackouts   '[]'$ Seizures   '[]'$ History of stroke   '[]'$ History of TIA  '[]'$ Aphasia   '[]'$ Temporary blindness   '[]'$ Dysphagia   '[]'$ Weakness or numbness in arms   '[]'$ Weakness or numbness in legs Musculoskeletal:  '[x]'$ Arthritis   '[]'$ Joint swelling   '[x]'$ Joint pain   '[]'$ Low back pain Hematologic:  '[]'$ Easy bruising  '[]'$ Easy bleeding   '[]'$ Hypercoagulable state   '[]'$ Anemic   Gastrointestinal:  '[]'$ Blood in stool   '[]'$ Vomiting blood  '[]'$ Gastroesophageal reflux/heartburn   '[]'$ Abdominal pain Genitourinary:  '[]'$ Chronic kidney disease   '[]'$ Difficult urination  '[]'$ Frequent  urination  '[]'$ Burning with urination   '[]'$ Hematuria Skin:  '[]'$ Rashes   '[]'$ Ulcers   '[]'$ Wounds Psychological:  '[]'$ History of anxiety   '[]'$  History of major depression.  Physical Examination  BP (!) 172/75 (BP Location: Right Arm)   Pulse (!) 56   Resp 16   Wt 179 lb 6.4 oz (81.4 kg)   BMI 30.79 kg/m  Gen:  WD/WN,  NAD Head: [place]/AT, No temporalis wasting. Ear/Nose/Throat: Hearing grossly intact, nares w/o erythema or drainage Eyes: Conjunctiva clear. Sclera non-icteric Neck: Supple.  Trachea midline Pulmonary:  Good air movement, no use of accessory muscles.  Cardiac: RRR, no JVD Vascular:  Vessel Right Left  Radial Palpable Palpable               Musculoskeletal: M/S 5/5 throughout.  No deformity or atrophy.  Prominent varicosities in the right medial leg still present measuring up to 3 mm in diameter.  She has a red irritated area in the proximal to mid upper thigh near the top of her laser ablation area.  No significant lower extremity edema. Neurologic: Sensation grossly intact in extremities.  Symmetrical.  Speech is fluent.  Psychiatric: Judgment intact, Mood & affect appropriate for pt's clinical situation. Dermatologic: No rashes or ulcers noted.  No cellulitis or open wounds.      Labs No results found for this or any previous visit (from the past 2160 hour(s)).  Radiology VAS [place] LOWER EXTREMITY VENOUS POST ABLATION  Result Date: 03/22/2022  Lower Venous Reflux Study Patient Name:  Caroline Hall  Date of Exam:   03/21/2022 Medical Rec #: [id number]         Accession #:    [id number] Date of Birth: 1945-12-30         Patient Gender: F Patient Age:   58 years Exam Location:  [facility] Procedure:      VAS [place] LOWER EXTREMITY VENOUS POST ABLATION Referring Phys: Corene Cornea Solash Tullo --------------------------------------------------------------------------------  Indications: Post Rt GSV Ablation Imaging.  Performing Technologist: Almira Coaster RVS  Examination Guidelines: A  complete evaluation includes B-mode imaging, spectral Doppler, color Doppler, and power Doppler as needed of all accessible portions of each vessel. Bilateral testing is considered an integral part of a complete examination. Limited examinations for reoccurring indications may be performed as noted. The reflux portion of the exam is performed with the patient in reverse Trendelenburg. Significant venous reflux is defined as >500 ms in the superficial venous system, and >1 second in the deep venous system.  Venous Reflux Times +--------------+--------+------+----------+------------+-----------------------+ RIGHT         Reflux  Reflux  Reflux  Diameter cmsComments                              No       Yes     Time                                       +--------------+--------+------+----------+------------+-----------------------+ CFV           no                                                          +--------------+--------+------+----------+------------+-----------------------+ FV prox       no                                                          +--------------+--------+------+----------+------------+-----------------------+  FV mid        no                                                          +--------------+--------+------+----------+------------+-----------------------+ FV dist       no                                                          +--------------+--------+------+----------+------------+-----------------------+ Popliteal     no                                                          +--------------+--------+------+----------+------------+-----------------------+ [facility]SV [facility]    no                                                          +--------------+--------+------+----------+------------+-----------------------+ GSV prox thigh                                    prior                                                                      ablation/stripping      +--------------+--------+------+----------+------------+-----------------------+ GSV mid thigh                                     prior                                                                     ablation/stripping      +--------------+--------+------+----------+------------+-----------------------+ GSV dist thigh                                    prior  ablation/stripping      +--------------+--------+------+----------+------------+-----------------------+ GSV at knee                                       prior                                                                     ablation/stripping      +--------------+--------+------+----------+------------+-----------------------+ GSV prox calf no                                                          +--------------+--------+------+----------+------------+-----------------------+   Summary: Right: - No evidence of deep vein thrombosis seen in the right lower extremity, from the common femoral through the popliteal veins. - No evidence of superficial venous thrombosis in the right lower extremity. - There is no evidence of venous reflux seen in the right lower extremity. - No evidence of superficial venous reflux seen in the right greater saphenous vein. - The Right GSV appears to be occluded from knee level to approximately 1.5 cms from the SFJ via Ablation.  *See table(s) above for measurements and observations. Electronically signed by Leotis Pain MD on 03/22/2022 at 11:02:45 AM.    Final    MM 3D SCREEN BREAST BILATERAL  Result Date: 03/09/2022 CLINICAL DATA:  Screening. EXAM: DIGITAL SCREENING BILATERAL MAMMOGRAM WITH TOMOSYNTHESIS AND CAD TECHNIQUE: Bilateral screening digital craniocaudal and mediolateral oblique mammograms were obtained. Bilateral screening digital breast tomosynthesis was  performed. The images were evaluated with computer-aided detection. COMPARISON:  Previous exam(s). ACR Breast Density Category b: There are scattered areas of fibroglandular density. FINDINGS: There are no findings suspicious for malignancy. IMPRESSION: No mammographic evidence of malignancy. A result letter of this screening mammogram will be mailed directly to the patient. RECOMMENDATION: Screening mammogram in one year. (Code:SM-B-01Y) BI-RADS CATEGORY  1: Negative. Electronically Signed   By: Ammie Ferrier M.D.   On: 03/09/2022 14:26    Assessment/Plan  Varicose veins of leg with pain, right Recommend:  The patient has had successful ablation of the previously incompetent saphenous venous system but still has persistent symptoms of pain and swelling that are having a negative impact on daily life and daily activities.  Patient should undergo injection sclerotherapy and foam sclerotherapy to treat the residual varicosities.  The risks, benefits and alternative therapies were reviewed in detail with the patient.  All questions were answered.  The patient agrees to proceed with sclerotherapy at their convenience.  The patient will continue wearing the graduated compression stockings and using the over-the-counter pain medications to treat her symptoms.       HTN, goal below 140/80 blood pressure control important in reducing the progression of atherosclerotic disease. On appropriate oral medications.   Type II diabetes mellitus with complication (HCC) blood glucose control important in reducing the progression of atherosclerotic disease. Also, involved in wound healing. On appropriate medications.    Leotis Pain, MD  04/06/2022 10:03 AM    This note was created with Dragon medical  transcription system.  Any errors from dictation are purely unintentional

## 2022-04-06 NOTE — Assessment & Plan Note (Signed)
Recommend:  The patient has had successful ablation of the previously incompetent saphenous venous system but still has persistent symptoms of pain and swelling that are having a negative impact on daily life and daily activities.  Patient should undergo injection sclerotherapy and foam sclerotherapy to treat the residual varicosities.  The risks, benefits and alternative therapies were reviewed in detail with the patient.  All questions were answered.  The patient agrees to proceed with sclerotherapy at their convenience.  The patient will continue wearing the graduated compression stockings and using the over-the-counter pain medications to treat her symptoms.

## 2022-04-10 ENCOUNTER — Ambulatory Visit (INDEPENDENT_AMBULATORY_CARE_PROVIDER_SITE_OTHER): Payer: Medicare HMO | Admitting: Vascular Surgery

## 2022-04-25 DIAGNOSIS — G35 Multiple sclerosis: Secondary | ICD-10-CM | POA: Diagnosis not present

## 2022-04-25 DIAGNOSIS — Z79899 Other long term (current) drug therapy: Secondary | ICD-10-CM | POA: Diagnosis not present

## 2022-04-25 DIAGNOSIS — E785 Hyperlipidemia, unspecified: Secondary | ICD-10-CM | POA: Diagnosis not present

## 2022-04-25 DIAGNOSIS — I10 Essential (primary) hypertension: Secondary | ICD-10-CM | POA: Diagnosis not present

## 2022-04-25 DIAGNOSIS — Z0000 Encounter for general adult medical examination without abnormal findings: Secondary | ICD-10-CM | POA: Diagnosis not present

## 2022-04-25 DIAGNOSIS — Z23 Encounter for immunization: Secondary | ICD-10-CM | POA: Diagnosis not present

## 2022-04-25 DIAGNOSIS — E119 Type 2 diabetes mellitus without complications: Secondary | ICD-10-CM | POA: Diagnosis not present

## 2022-06-07 DIAGNOSIS — E782 Mixed hyperlipidemia: Secondary | ICD-10-CM | POA: Diagnosis not present

## 2022-06-07 DIAGNOSIS — I10 Essential (primary) hypertension: Secondary | ICD-10-CM | POA: Diagnosis not present

## 2022-06-07 DIAGNOSIS — Z79899 Other long term (current) drug therapy: Secondary | ICD-10-CM | POA: Diagnosis not present

## 2022-06-07 DIAGNOSIS — E118 Type 2 diabetes mellitus with unspecified complications: Secondary | ICD-10-CM | POA: Diagnosis not present

## 2022-07-03 ENCOUNTER — Encounter (INDEPENDENT_AMBULATORY_CARE_PROVIDER_SITE_OTHER): Payer: Self-pay | Admitting: Vascular Surgery

## 2022-07-03 ENCOUNTER — Ambulatory Visit (INDEPENDENT_AMBULATORY_CARE_PROVIDER_SITE_OTHER): Payer: Medicare HMO | Admitting: Vascular Surgery

## 2022-07-03 VITALS — BP 132/74 | HR 60 | Resp 16 | Wt 182.0 lb

## 2022-07-03 DIAGNOSIS — I83811 Varicose veins of right lower extremities with pain: Secondary | ICD-10-CM | POA: Diagnosis not present

## 2022-07-03 NOTE — Progress Notes (Signed)
Caroline Hall is a 76 y.o.female who presents with painful varicose veins of the right leg  Past Medical History:  Diagnosis Date   Arthritis    left knee   Asthma    Cancer ([place]) 2005   skin, face   Diabetes mellitus without complication (HCC)    GERD (gastroesophageal reflux disease)    Hyperlipidemia    Multiple sclerosis (HCC)    PONV (postoperative nausea and vomiting)    Sleep apnea    CPAP   Vertigo    last episode 03/2017   Wears dentures    partial upper and lower    Past Surgical History:  Procedure Laterality Date   ABDOMINAL HYSTERECTOMY     BREAST CYST ASPIRATION Right 01/01/2014   negative   BREAST EXCISIONAL BIOPSY Bilateral 1970   surgical biopsy, negative, approximate dates   BROW LIFT Bilateral 03/25/2018   Procedure: BLEPHAROPLASTY UPPER EYELID WITH EXCESS SKIN DIABETIC;  Surgeon: Karle Starch, MD;  Location: [place];  Service: Ophthalmology;  Laterality: Bilateral;  Diabetic - oral meds sleep apnea   COLONOSCOPY  [id number]   COLONOSCOPY WITH PROPOFOL N/A 01/17/2015   Procedure: COLONOSCOPY WITH PROPOFOL;  Surgeon: Manya Silvas, MD;  Location: [facility] ENDOSCOPY;  Service: Endoscopy;  Laterality: N/A;   ESOPHAGOGASTRODUODENOSCOPY     EXCISION PARTIAL PHALANX Right 01/29/2018   Procedure: EXCISION PARTIAL PHALANX-4TH TOE(HEMIPHALANGECTOMY);  Surgeon: Samara Deist, DPM;  Location: [place];  Service: Podiatry;  Laterality: Right;  Diabetic - oral meds   HAMMER TOE SURGERY Right 01/29/2018   Procedure: HAMMER TOE CORRECTION-5TH TOE;  Surgeon: Samara Deist, DPM;  Location: [place];  Service: Podiatry;  Laterality: Right;   MENINGIOMA REMOVAL      Current Outpatient Medications  Medication Sig Dispense Refill   amLODipine (NORVASC) 5 MG tablet Take 5 mg by mouth daily.     aspirin EC 81 MG tablet Take 81 mg by mouth daily.     atorvastatin (LIPITOR) 10 MG tablet Take 10 mg by mouth daily.     BIOTIN PO Take by mouth  daily.     bisoprolol-hydrochlorothiazide (ZIAC) 5-6.25 MG tablet Take 1 tablet by mouth daily.     cetirizine (ZYRTEC) 10 MG tablet Take 10 mg by mouth daily.     Cholecalciferol (D3-1000) 25 MCG (1000 UT) capsule Take 1,000 Units by mouth daily.     Cyanocobalamin 1500 MCG TBDP Take 1 tablet by mouth daily.     famotidine (PEPCID) 10 MG tablet Take 10 mg by mouth daily as needed for heartburn or indigestion.     gabapentin (NEURONTIN) 300 MG capsule Take 300 mg by mouth at bedtime.     glimepiride (AMARYL) 4 MG tablet Take 4 mg by mouth daily with breakfast.     losartan (COZAAR) 100 MG tablet Take by mouth.     montelukast (SINGULAIR) 10 MG tablet Take 10 mg by mouth at bedtime.     NON FORMULARY Take 10 mg by mouth 2 (two) times daily. 4 aminopyridine     oxybutynin (DITROPAN-XL) 10 MG 24 hr tablet Take 10 mg by mouth daily.     pioglitazone (ACTOS) 30 MG tablet Take 1 tablet by mouth daily.     Turmeric (QC TUMERIC COMPLEX PO) Take by mouth.     erythromycin ophthalmic ointment Apply to sutures 4 times a day for 10-12 days.  Discontinue if allergy develops and call our office (Patient not taking: Reported on 11/27/2021) 3.5 g 2  folic acid (FOLVITE) 342 MCG tablet Take 800 mcg by mouth daily. (Patient not taking: Reported on 11/27/2021)     phenytoin (DILANTIN) 100 MG ER capsule Take 300 mg by mouth at bedtime. (Patient not taking: Reported on 11/27/2021)     predniSONE (DELTASONE) 10 MG tablet Take by mouth. (Patient not taking: Reported on 11/27/2021)     No current facility-administered medications for this visit.    Allergies  Allergen Reactions   Erythromycin     Other reaction(s): Unknown   Naproxen Other (See Comments)    Stomach upset   Omeprazole Diarrhea   Other Other (See Comments)    EES - stomach upset    Indication: Patient presents with symptomatic varicose veins of the right lower extremity.  Procedure: Foam sclerotherapy was performed on the right lower extremity.  Using ultrasound guidance, 5 mL of foam Sotradecol was used to inject the varicosities of the right lower extremity. Compression wraps were placed. The patient tolerated the procedure well.

## 2022-08-10 DIAGNOSIS — M6289 Other specified disorders of muscle: Secondary | ICD-10-CM | POA: Diagnosis not present

## 2022-08-10 DIAGNOSIS — G35 Multiple sclerosis: Secondary | ICD-10-CM | POA: Diagnosis not present

## 2022-08-10 DIAGNOSIS — E559 Vitamin D deficiency, unspecified: Secondary | ICD-10-CM | POA: Diagnosis not present

## 2022-08-10 DIAGNOSIS — R253 Fasciculation: Secondary | ICD-10-CM | POA: Diagnosis not present

## 2022-08-10 DIAGNOSIS — Z9889 Other specified postprocedural states: Secondary | ICD-10-CM | POA: Diagnosis not present

## 2022-08-10 DIAGNOSIS — Z8603 Personal history of neoplasm of uncertain behavior: Secondary | ICD-10-CM | POA: Diagnosis not present

## 2022-11-01 ENCOUNTER — Other Ambulatory Visit: Payer: Self-pay | Admitting: Internal Medicine

## 2022-11-01 DIAGNOSIS — Z1231 Encounter for screening mammogram for malignant neoplasm of breast: Secondary | ICD-10-CM

## 2022-11-01 DIAGNOSIS — E119 Type 2 diabetes mellitus without complications: Secondary | ICD-10-CM | POA: Diagnosis not present

## 2022-11-01 DIAGNOSIS — G35 Multiple sclerosis: Secondary | ICD-10-CM | POA: Diagnosis not present

## 2022-11-01 DIAGNOSIS — Z79899 Other long term (current) drug therapy: Secondary | ICD-10-CM | POA: Diagnosis not present

## 2022-11-01 DIAGNOSIS — E785 Hyperlipidemia, unspecified: Secondary | ICD-10-CM | POA: Diagnosis not present

## 2022-11-01 DIAGNOSIS — Z0000 Encounter for general adult medical examination without abnormal findings: Secondary | ICD-10-CM | POA: Diagnosis not present

## 2022-11-01 DIAGNOSIS — G4733 Obstructive sleep apnea (adult) (pediatric): Secondary | ICD-10-CM | POA: Diagnosis not present

## 2022-11-01 DIAGNOSIS — E559 Vitamin D deficiency, unspecified: Secondary | ICD-10-CM | POA: Diagnosis not present

## 2022-11-01 DIAGNOSIS — I10 Essential (primary) hypertension: Secondary | ICD-10-CM | POA: Diagnosis not present

## 2022-12-12 DIAGNOSIS — J9811 Atelectasis: Secondary | ICD-10-CM | POA: Diagnosis not present

## 2022-12-12 DIAGNOSIS — R079 Chest pain, unspecified: Secondary | ICD-10-CM | POA: Diagnosis not present

## 2022-12-12 DIAGNOSIS — R0781 Pleurodynia: Secondary | ICD-10-CM | POA: Diagnosis not present

## 2022-12-18 ENCOUNTER — Other Ambulatory Visit: Payer: Self-pay | Admitting: Internal Medicine

## 2022-12-18 DIAGNOSIS — R0781 Pleurodynia: Secondary | ICD-10-CM

## 2022-12-18 DIAGNOSIS — R7989 Other specified abnormal findings of blood chemistry: Secondary | ICD-10-CM

## 2022-12-25 ENCOUNTER — Ambulatory Visit
Admission: RE | Admit: 2022-12-25 | Discharge: 2022-12-25 | Disposition: A | Discharge status: Home / Self Care | Payer: Medicare HMO | Source: Ambulatory Visit | Attending: Internal Medicine | Admitting: Internal Medicine

## 2022-12-25 DIAGNOSIS — R7989 Other specified abnormal findings of blood chemistry: Secondary | ICD-10-CM | POA: Insufficient documentation

## 2022-12-25 DIAGNOSIS — R0781 Pleurodynia: Secondary | ICD-10-CM | POA: Diagnosis not present

## 2022-12-25 DIAGNOSIS — J9811 Atelectasis: Secondary | ICD-10-CM | POA: Diagnosis not present

## 2022-12-25 DIAGNOSIS — R079 Chest pain, unspecified: Secondary | ICD-10-CM | POA: Diagnosis not present

## 2022-12-25 LAB — POCT I-STAT CREATININE: Creatinine, Ser: 1.1 mg/dL — ABNORMAL HIGH (ref 0.44–1.00)

## 2022-12-25 MED ORDER — IOHEXOL 350 MG/ML SOLN
75.0000 mL | Freq: Once | INTRAVENOUS | Status: AC | PRN
  Administered 2022-12-25: 75 mL via INTRAVENOUS

## 2022-12-27 DIAGNOSIS — D225 Melanocytic nevi of trunk: Secondary | ICD-10-CM | POA: Diagnosis not present

## 2022-12-27 DIAGNOSIS — D2272 Melanocytic nevi of left lower limb, including hip: Secondary | ICD-10-CM | POA: Diagnosis not present

## 2022-12-27 DIAGNOSIS — D2262 Melanocytic nevi of left upper limb, including shoulder: Secondary | ICD-10-CM | POA: Diagnosis not present

## 2022-12-27 DIAGNOSIS — I872 Venous insufficiency (chronic) (peripheral): Secondary | ICD-10-CM | POA: Diagnosis not present

## 2022-12-27 DIAGNOSIS — Z85828 Personal history of other malignant neoplasm of skin: Secondary | ICD-10-CM | POA: Diagnosis not present

## 2022-12-27 DIAGNOSIS — L821 Other seborrheic keratosis: Secondary | ICD-10-CM | POA: Diagnosis not present

## 2022-12-27 DIAGNOSIS — D2271 Melanocytic nevi of right lower limb, including hip: Secondary | ICD-10-CM | POA: Diagnosis not present

## 2022-12-27 DIAGNOSIS — D2261 Melanocytic nevi of right upper limb, including shoulder: Secondary | ICD-10-CM | POA: Diagnosis not present

## 2022-12-27 DIAGNOSIS — Z08 Encounter for follow-up examination after completed treatment for malignant neoplasm: Secondary | ICD-10-CM | POA: Diagnosis not present

## 2023-02-01 DIAGNOSIS — Z6832 Body mass index (BMI) 32.0-32.9, adult: Secondary | ICD-10-CM | POA: Diagnosis not present

## 2023-02-01 DIAGNOSIS — I10 Essential (primary) hypertension: Secondary | ICD-10-CM | POA: Diagnosis not present

## 2023-02-01 DIAGNOSIS — E782 Mixed hyperlipidemia: Secondary | ICD-10-CM | POA: Diagnosis not present

## 2023-02-01 DIAGNOSIS — Z79899 Other long term (current) drug therapy: Secondary | ICD-10-CM | POA: Diagnosis not present

## 2023-02-01 DIAGNOSIS — E118 Type 2 diabetes mellitus with unspecified complications: Secondary | ICD-10-CM | POA: Diagnosis not present

## 2023-02-01 DIAGNOSIS — E6609 Other obesity due to excess calories: Secondary | ICD-10-CM | POA: Diagnosis not present

## 2023-02-01 DIAGNOSIS — G35 Multiple sclerosis: Secondary | ICD-10-CM | POA: Diagnosis not present

## 2023-03-14 ENCOUNTER — Ambulatory Visit
Admission: RE | Admit: 2023-03-14 | Discharge: 2023-03-14 | Disposition: A | Discharge status: Home / Self Care | Payer: Medicare HMO | Source: Ambulatory Visit | Attending: Internal Medicine | Admitting: Internal Medicine

## 2023-03-14 DIAGNOSIS — Z1231 Encounter for screening mammogram for malignant neoplasm of breast: Secondary | ICD-10-CM | POA: Insufficient documentation

## 2023-05-13 DIAGNOSIS — E118 Type 2 diabetes mellitus with unspecified complications: Secondary | ICD-10-CM | POA: Diagnosis not present

## 2023-05-13 DIAGNOSIS — E782 Mixed hyperlipidemia: Secondary | ICD-10-CM | POA: Diagnosis not present

## 2023-05-13 DIAGNOSIS — Z79899 Other long term (current) drug therapy: Secondary | ICD-10-CM | POA: Diagnosis not present

## 2023-05-13 DIAGNOSIS — I10 Essential (primary) hypertension: Secondary | ICD-10-CM | POA: Diagnosis not present

## 2023-05-20 DIAGNOSIS — E6609 Other obesity due to excess calories: Secondary | ICD-10-CM | POA: Diagnosis not present

## 2023-05-20 DIAGNOSIS — Z6833 Body mass index (BMI) 33.0-33.9, adult: Secondary | ICD-10-CM | POA: Diagnosis not present

## 2023-05-20 DIAGNOSIS — Z79899 Other long term (current) drug therapy: Secondary | ICD-10-CM | POA: Diagnosis not present

## 2023-05-20 DIAGNOSIS — E119 Type 2 diabetes mellitus without complications: Secondary | ICD-10-CM | POA: Diagnosis not present

## 2023-05-20 DIAGNOSIS — Z23 Encounter for immunization: Secondary | ICD-10-CM | POA: Diagnosis not present

## 2023-05-20 DIAGNOSIS — I10 Essential (primary) hypertension: Secondary | ICD-10-CM | POA: Diagnosis not present

## 2023-05-20 DIAGNOSIS — Z0000 Encounter for general adult medical examination without abnormal findings: Secondary | ICD-10-CM | POA: Diagnosis not present

## 2023-05-20 DIAGNOSIS — G35 Multiple sclerosis: Secondary | ICD-10-CM | POA: Diagnosis not present

## 2023-05-20 DIAGNOSIS — E782 Mixed hyperlipidemia: Secondary | ICD-10-CM | POA: Diagnosis not present

## 2023-08-12 DIAGNOSIS — E559 Vitamin D deficiency, unspecified: Secondary | ICD-10-CM | POA: Diagnosis not present

## 2023-08-12 DIAGNOSIS — R253 Fasciculation: Secondary | ICD-10-CM | POA: Diagnosis not present

## 2023-08-12 DIAGNOSIS — M6289 Other specified disorders of muscle: Secondary | ICD-10-CM | POA: Diagnosis not present

## 2023-08-12 DIAGNOSIS — G35 Multiple sclerosis: Secondary | ICD-10-CM | POA: Diagnosis not present

## 2023-11-07 DIAGNOSIS — M6289 Other specified disorders of muscle: Secondary | ICD-10-CM | POA: Diagnosis not present

## 2023-11-07 DIAGNOSIS — E782 Mixed hyperlipidemia: Secondary | ICD-10-CM | POA: Diagnosis not present

## 2023-11-07 DIAGNOSIS — Z79899 Other long term (current) drug therapy: Secondary | ICD-10-CM | POA: Diagnosis not present

## 2023-11-07 DIAGNOSIS — G35 Multiple sclerosis: Secondary | ICD-10-CM | POA: Diagnosis not present

## 2023-11-07 DIAGNOSIS — I10 Essential (primary) hypertension: Secondary | ICD-10-CM | POA: Diagnosis not present

## 2023-11-07 DIAGNOSIS — E118 Type 2 diabetes mellitus with unspecified complications: Secondary | ICD-10-CM | POA: Diagnosis not present

## 2023-11-15 DIAGNOSIS — E785 Hyperlipidemia, unspecified: Secondary | ICD-10-CM | POA: Diagnosis not present

## 2023-11-15 DIAGNOSIS — Z0000 Encounter for general adult medical examination without abnormal findings: Secondary | ICD-10-CM | POA: Diagnosis not present

## 2023-11-15 DIAGNOSIS — E119 Type 2 diabetes mellitus without complications: Secondary | ICD-10-CM | POA: Diagnosis not present

## 2023-11-15 DIAGNOSIS — E669 Obesity, unspecified: Secondary | ICD-10-CM | POA: Diagnosis not present

## 2023-11-15 DIAGNOSIS — I10 Essential (primary) hypertension: Secondary | ICD-10-CM | POA: Diagnosis not present

## 2023-11-15 DIAGNOSIS — Z6832 Body mass index (BMI) 32.0-32.9, adult: Secondary | ICD-10-CM | POA: Diagnosis not present

## 2024-01-02 DIAGNOSIS — D225 Melanocytic nevi of trunk: Secondary | ICD-10-CM | POA: Diagnosis not present

## 2024-01-02 DIAGNOSIS — Z85828 Personal history of other malignant neoplasm of skin: Secondary | ICD-10-CM | POA: Diagnosis not present

## 2024-01-02 DIAGNOSIS — D2262 Melanocytic nevi of left upper limb, including shoulder: Secondary | ICD-10-CM | POA: Diagnosis not present

## 2024-01-02 DIAGNOSIS — D2261 Melanocytic nevi of right upper limb, including shoulder: Secondary | ICD-10-CM | POA: Diagnosis not present

## 2024-01-02 DIAGNOSIS — D2271 Melanocytic nevi of right lower limb, including hip: Secondary | ICD-10-CM | POA: Diagnosis not present

## 2024-01-20 DIAGNOSIS — H2513 Age-related nuclear cataract, bilateral: Secondary | ICD-10-CM | POA: Diagnosis not present

## 2024-02-11 ENCOUNTER — Other Ambulatory Visit: Payer: Self-pay | Admitting: Internal Medicine

## 2024-02-11 DIAGNOSIS — Z1231 Encounter for screening mammogram for malignant neoplasm of breast: Secondary | ICD-10-CM

## 2024-03-02 DIAGNOSIS — Z6830 Body mass index (BMI) 30.0-30.9, adult: Secondary | ICD-10-CM | POA: Diagnosis not present

## 2024-03-02 DIAGNOSIS — E119 Type 2 diabetes mellitus without complications: Secondary | ICD-10-CM | POA: Diagnosis not present

## 2024-03-02 DIAGNOSIS — G35 Multiple sclerosis: Secondary | ICD-10-CM | POA: Diagnosis not present

## 2024-03-02 DIAGNOSIS — Z79899 Other long term (current) drug therapy: Secondary | ICD-10-CM | POA: Diagnosis not present

## 2024-03-02 DIAGNOSIS — Z1231 Encounter for screening mammogram for malignant neoplasm of breast: Secondary | ICD-10-CM | POA: Diagnosis not present

## 2024-03-02 DIAGNOSIS — E782 Mixed hyperlipidemia: Secondary | ICD-10-CM | POA: Diagnosis not present

## 2024-03-02 DIAGNOSIS — E6609 Other obesity due to excess calories: Secondary | ICD-10-CM | POA: Diagnosis not present

## 2024-03-02 DIAGNOSIS — I10 Essential (primary) hypertension: Secondary | ICD-10-CM | POA: Diagnosis not present

## 2024-03-02 DIAGNOSIS — E66811 Obesity, class 1: Secondary | ICD-10-CM | POA: Diagnosis not present

## 2024-03-17 ENCOUNTER — Ambulatory Visit
Admission: RE | Admit: 2024-03-17 | Discharge: 2024-03-17 | Disposition: A | Discharge status: Home / Self Care | Source: Ambulatory Visit | Attending: Internal Medicine | Admitting: Internal Medicine

## 2024-03-17 DIAGNOSIS — Z1231 Encounter for screening mammogram for malignant neoplasm of breast: Secondary | ICD-10-CM | POA: Insufficient documentation

## 2024-04-30 DIAGNOSIS — E66811 Obesity, class 1: Secondary | ICD-10-CM | POA: Diagnosis not present

## 2024-04-30 DIAGNOSIS — G35D Multiple sclerosis, unspecified: Secondary | ICD-10-CM | POA: Diagnosis not present

## 2024-04-30 DIAGNOSIS — H2512 Age-related nuclear cataract, left eye: Secondary | ICD-10-CM | POA: Diagnosis not present

## 2024-04-30 DIAGNOSIS — E1136 Type 2 diabetes mellitus with diabetic cataract: Secondary | ICD-10-CM | POA: Diagnosis not present

## 2024-04-30 DIAGNOSIS — E6609 Other obesity due to excess calories: Secondary | ICD-10-CM | POA: Diagnosis not present

## 2024-04-30 DIAGNOSIS — Z6832 Body mass index (BMI) 32.0-32.9, adult: Secondary | ICD-10-CM | POA: Diagnosis not present

## 2024-04-30 DIAGNOSIS — Z886 Allergy status to analgesic agent status: Secondary | ICD-10-CM | POA: Diagnosis not present

## 2024-04-30 DIAGNOSIS — G4733 Obstructive sleep apnea (adult) (pediatric): Secondary | ICD-10-CM | POA: Diagnosis not present

## 2024-04-30 DIAGNOSIS — I10 Essential (primary) hypertension: Secondary | ICD-10-CM | POA: Diagnosis not present

## 2024-06-15 DIAGNOSIS — Z9842 Cataract extraction status, left eye: Secondary | ICD-10-CM | POA: Diagnosis not present

## 2024-06-15 DIAGNOSIS — Z79899 Other long term (current) drug therapy: Secondary | ICD-10-CM | POA: Diagnosis not present

## 2024-06-15 DIAGNOSIS — H2511 Age-related nuclear cataract, right eye: Secondary | ICD-10-CM | POA: Diagnosis not present
